# Patient Record
Sex: Female | Born: 1995 | Race: Black or African American | Hispanic: No | Marital: Single | Smoking: Former smoker
Health system: Southern US, Community
[De-identification: ages and names within clinical notes are randomized; demographics above are authoritative.]

## PROBLEM LIST (undated history)

## (undated) DIAGNOSIS — O24419 Gestational diabetes mellitus in pregnancy, unspecified control: Secondary | ICD-10-CM

## (undated) DIAGNOSIS — Z789 Other specified health status: Secondary | ICD-10-CM

## (undated) HISTORY — PX: OTHER SURGICAL HISTORY: SHX169

## (undated) HISTORY — PX: DIAGNOSTIC LAPAROSCOPY: SUR761

---

## 2014-06-22 ENCOUNTER — Encounter (HOSPITAL_COMMUNITY): Payer: Self-pay | Admitting: Pharmacist

## 2014-06-23 ENCOUNTER — Encounter (HOSPITAL_COMMUNITY): Payer: Self-pay | Admitting: *Deleted

## 2014-06-24 ENCOUNTER — Other Ambulatory Visit: Payer: Self-pay | Admitting: Obstetrics & Gynecology

## 2014-06-27 ENCOUNTER — Ambulatory Visit (HOSPITAL_COMMUNITY): Payer: 59 | Admitting: Certified Registered Nurse Anesthetist

## 2014-06-27 ENCOUNTER — Encounter (HOSPITAL_COMMUNITY)
Admission: RE | Disposition: A | Discharge status: Home / Self Care | Payer: Self-pay | Source: Ambulatory Visit | Attending: Obstetrics & Gynecology

## 2014-06-27 ENCOUNTER — Encounter (HOSPITAL_COMMUNITY): Payer: Self-pay

## 2014-06-27 ENCOUNTER — Ambulatory Visit (HOSPITAL_COMMUNITY)
Admission: RE | Admit: 2014-06-27 | Discharge: 2014-06-27 | Disposition: A | Discharge status: Home / Self Care | Payer: 59 | Source: Ambulatory Visit | Attending: Obstetrics & Gynecology | Admitting: Obstetrics & Gynecology

## 2014-06-27 ENCOUNTER — Encounter (HOSPITAL_COMMUNITY): Payer: 59 | Admitting: Certified Registered Nurse Anesthetist

## 2014-06-27 DIAGNOSIS — Z8742 Personal history of other diseases of the female genital tract: Secondary | ICD-10-CM

## 2014-06-27 DIAGNOSIS — D3911 Neoplasm of uncertain behavior of right ovary: Secondary | ICD-10-CM | POA: Insufficient documentation

## 2014-06-27 DIAGNOSIS — Z9889 Other specified postprocedural states: Secondary | ICD-10-CM

## 2014-06-27 DIAGNOSIS — N8320 Unspecified ovarian cysts: Secondary | ICD-10-CM | POA: Diagnosis present

## 2014-06-27 HISTORY — DX: Other specified health status: Z78.9

## 2014-06-27 HISTORY — PX: LAPAROSCOPY: SHX197

## 2014-06-27 LAB — CBC
HCT: 37.5 % (ref 36.0–46.0)
Hemoglobin: 12.9 g/dL (ref 12.0–15.0)
MCH: 31.4 pg (ref 26.0–34.0)
MCHC: 34.4 g/dL (ref 30.0–36.0)
MCV: 91.2 fL (ref 78.0–100.0)
PLATELETS: 238 10*3/uL (ref 150–400)
RBC: 4.11 MIL/uL (ref 3.87–5.11)
RDW: 11.9 % (ref 11.5–15.5)
WBC: 5.9 10*3/uL (ref 4.0–10.5)

## 2014-06-27 LAB — PREGNANCY, URINE: PREG TEST UR: NEGATIVE

## 2014-06-27 SURGERY — LAPAROSCOPY OPERATIVE
Anesthesia: General | Site: Abdomen

## 2014-06-27 MED ORDER — NEOSTIGMINE METHYLSULFATE 10 MG/10ML IV SOLN
INTRAVENOUS | Status: DC | PRN
  Administered 2014-06-27: 1 mg via INTRAVENOUS
  Administered 2014-06-27: 2.5 mg via INTRAVENOUS

## 2014-06-27 MED ORDER — FENTANYL CITRATE 0.05 MG/ML IJ SOLN
INTRAMUSCULAR | Status: DC | PRN
  Administered 2014-06-27 (×2): 100 ug via INTRAVENOUS
  Administered 2014-06-27: 25 ug via INTRAVENOUS
  Administered 2014-06-27: 100 ug via INTRAVENOUS
  Administered 2014-06-27: 50 ug via INTRAVENOUS

## 2014-06-27 MED ORDER — HEPARIN SODIUM (PORCINE) 5000 UNIT/ML IJ SOLN
INTRAMUSCULAR | Status: AC
  Filled 2014-06-27: qty 1

## 2014-06-27 MED ORDER — NEOSTIGMINE METHYLSULFATE 10 MG/10ML IV SOLN
INTRAVENOUS | Status: AC
  Filled 2014-06-27: qty 1

## 2014-06-27 MED ORDER — FENTANYL CITRATE 0.05 MG/ML IJ SOLN
INTRAMUSCULAR | Status: AC
  Administered 2014-06-27: 50 ug via INTRAVENOUS
  Filled 2014-06-27: qty 2

## 2014-06-27 MED ORDER — SODIUM CHLORIDE 0.9 % IJ SOLN
INTRAMUSCULAR | Status: AC
  Filled 2014-06-27: qty 10

## 2014-06-27 MED ORDER — DEXAMETHASONE SODIUM PHOSPHATE 4 MG/ML IJ SOLN
INTRAMUSCULAR | Status: AC
  Filled 2014-06-27: qty 1

## 2014-06-27 MED ORDER — LACTATED RINGERS IV SOLN
INTRAVENOUS | Status: DC
  Administered 2014-06-27: 10 mL/h via INTRAVENOUS
  Administered 2014-06-27 (×2): via INTRAVENOUS

## 2014-06-27 MED ORDER — OXYCODONE-ACETAMINOPHEN 5-325 MG PO TABS
ORAL_TABLET | ORAL | Status: AC
  Filled 2014-06-27: qty 1

## 2014-06-27 MED ORDER — SCOPOLAMINE 1 MG/3DAYS TD PT72
MEDICATED_PATCH | TRANSDERMAL | Status: AC
  Filled 2014-06-27: qty 1

## 2014-06-27 MED ORDER — BUPIVACAINE HCL 0.25 % IJ SOLN
INTRAMUSCULAR | Status: DC | PRN
  Administered 2014-06-27: 10 mL

## 2014-06-27 MED ORDER — SILVER NITRATE-POT NITRATE 75-25 % EX MISC
CUTANEOUS | Status: DC | PRN
  Administered 2014-06-27: 3

## 2014-06-27 MED ORDER — ROCURONIUM BROMIDE 100 MG/10ML IV SOLN
INTRAVENOUS | Status: DC | PRN
  Administered 2014-06-27: 40 mg via INTRAVENOUS

## 2014-06-27 MED ORDER — BUPIVACAINE HCL (PF) 0.25 % IJ SOLN
INTRAMUSCULAR | Status: AC
  Filled 2014-06-27: qty 30

## 2014-06-27 MED ORDER — ONDANSETRON HCL 4 MG/2ML IJ SOLN
INTRAMUSCULAR | Status: AC
  Filled 2014-06-27: qty 2

## 2014-06-27 MED ORDER — PROPOFOL 10 MG/ML IV BOLUS
INTRAVENOUS | Status: DC | PRN
  Administered 2014-06-27: 50 mg via INTRAVENOUS
  Administered 2014-06-27: 200 mg via INTRAVENOUS
  Administered 2014-06-27: 40 mg via INTRAVENOUS

## 2014-06-27 MED ORDER — HEPARIN SODIUM (PORCINE) 5000 UNIT/ML IJ SOLN
INTRAMUSCULAR | Status: DC | PRN
  Administered 2014-06-27: 5000 [IU]

## 2014-06-27 MED ORDER — MIDAZOLAM HCL 2 MG/2ML IJ SOLN
INTRAMUSCULAR | Status: AC
  Filled 2014-06-27: qty 2

## 2014-06-27 MED ORDER — HYDROMORPHONE HCL 1 MG/ML IJ SOLN
INTRAMUSCULAR | Status: DC | PRN
  Administered 2014-06-27: 1 mg via INTRAVENOUS

## 2014-06-27 MED ORDER — PROPOFOL 10 MG/ML IV EMUL
INTRAVENOUS | Status: AC
  Filled 2014-06-27: qty 20

## 2014-06-27 MED ORDER — SILVER NITRATE-POT NITRATE 75-25 % EX MISC
CUTANEOUS | Status: AC
  Filled 2014-06-27: qty 1

## 2014-06-27 MED ORDER — HYDROMORPHONE HCL 1 MG/ML IJ SOLN
INTRAMUSCULAR | Status: AC
  Filled 2014-06-27: qty 1

## 2014-06-27 MED ORDER — FENTANYL CITRATE 0.05 MG/ML IJ SOLN
INTRAMUSCULAR | Status: AC
  Filled 2014-06-27: qty 5

## 2014-06-27 MED ORDER — OXYCODONE-ACETAMINOPHEN 5-325 MG PO TABS
1.0000 | ORAL_TABLET | ORAL | Status: DC | PRN
  Administered 2014-06-27: 1 via ORAL

## 2014-06-27 MED ORDER — MEPERIDINE HCL 25 MG/ML IJ SOLN: 6.2500 mg | INTRAMUSCULAR | Status: DC | PRN

## 2014-06-27 MED ORDER — KETOROLAC TROMETHAMINE 30 MG/ML IJ SOLN: 15.0000 mg | Freq: Once | INTRAMUSCULAR | Status: DC | PRN

## 2014-06-27 MED ORDER — LACTATED RINGERS IR SOLN
Status: DC | PRN
  Administered 2014-06-27: 3000 mL

## 2014-06-27 MED ORDER — DEXAMETHASONE SODIUM PHOSPHATE 4 MG/ML IJ SOLN
INTRAMUSCULAR | Status: DC | PRN
  Administered 2014-06-27: 4 mg via INTRAVENOUS

## 2014-06-27 MED ORDER — KETOROLAC TROMETHAMINE 30 MG/ML IJ SOLN
INTRAMUSCULAR | Status: DC | PRN
  Administered 2014-06-27: 30 mg via INTRAVENOUS

## 2014-06-27 MED ORDER — FENTANYL CITRATE 0.05 MG/ML IJ SOLN
25.0000 ug | INTRAMUSCULAR | Status: DC | PRN
  Administered 2014-06-27 (×2): 50 ug via INTRAVENOUS

## 2014-06-27 MED ORDER — ROCURONIUM BROMIDE 100 MG/10ML IV SOLN
INTRAVENOUS | Status: AC
  Filled 2014-06-27: qty 1

## 2014-06-27 MED ORDER — MIDAZOLAM HCL 2 MG/2ML IJ SOLN
INTRAMUSCULAR | Status: DC | PRN
  Administered 2014-06-27: 2 mg via INTRAVENOUS

## 2014-06-27 MED ORDER — LIDOCAINE HCL (CARDIAC) 20 MG/ML IV SOLN
INTRAVENOUS | Status: DC | PRN
  Administered 2014-06-27: 80 mg via INTRAVENOUS

## 2014-06-27 MED ORDER — LIDOCAINE HCL (CARDIAC) 20 MG/ML IV SOLN
INTRAVENOUS | Status: AC
  Filled 2014-06-27: qty 5

## 2014-06-27 MED ORDER — GLYCOPYRROLATE 0.2 MG/ML IJ SOLN
INTRAMUSCULAR | Status: AC
  Filled 2014-06-27: qty 2

## 2014-06-27 MED ORDER — GLYCOPYRROLATE 0.2 MG/ML IJ SOLN
INTRAMUSCULAR | Status: DC | PRN
  Administered 2014-06-27: 0.4 mg via INTRAVENOUS
  Administered 2014-06-27: .2 mg via INTRAVENOUS

## 2014-06-27 MED ORDER — SCOPOLAMINE 1 MG/3DAYS TD PT72
1.0000 | MEDICATED_PATCH | TRANSDERMAL | Status: DC
  Administered 2014-06-27: 1.5 mg via TRANSDERMAL

## 2014-06-27 MED ORDER — PROMETHAZINE HCL 25 MG/ML IJ SOLN: 6.2500 mg | INTRAMUSCULAR | Status: DC | PRN

## 2014-06-27 SURGICAL SUPPLY — 39 items
BENZOIN TINCTURE PRP APPL 2/3 (GAUZE/BANDAGES/DRESSINGS) ×3 IMPLANT
BLADE SURG 11 STRL SS (BLADE) ×3 IMPLANT
CABLE HIGH FREQUENCY MONO STRZ (ELECTRODE) ×3 IMPLANT
CATH ROBINSON RED A/P 16FR (CATHETERS) ×3 IMPLANT
CLOSURE WOUND 1/4X4 (GAUZE/BANDAGES/DRESSINGS) ×1
CLOTH BEACON ORANGE TIMEOUT ST (SAFETY) ×3 IMPLANT
DERMABOND ADVANCED (GAUZE/BANDAGES/DRESSINGS) ×2
DERMABOND ADVANCED .7 DNX12 (GAUZE/BANDAGES/DRESSINGS) ×1 IMPLANT
DRSG COVADERM PLUS 2X2 (GAUZE/BANDAGES/DRESSINGS) ×6 IMPLANT
DRSG OPSITE POSTOP 3X4 (GAUZE/BANDAGES/DRESSINGS) ×3 IMPLANT
DURAPREP 26ML APPLICATOR (WOUND CARE) ×3 IMPLANT
ELECT REM PT RETURN 9FT ADLT (ELECTROSURGICAL) ×3
ELECTRODE REM PT RTRN 9FT ADLT (ELECTROSURGICAL) ×1 IMPLANT
FILTER SMOKE EVAC LAPAROSHD (FILTER) ×3 IMPLANT
GLOVE BIO SURGEON STRL SZ 6.5 (GLOVE) ×2 IMPLANT
GLOVE BIO SURGEONS STRL SZ 6.5 (GLOVE) ×1
GLOVE BIOGEL PI IND STRL 7.0 (GLOVE) ×2 IMPLANT
GLOVE BIOGEL PI INDICATOR 7.0 (GLOVE) ×4
GOWN STRL REUS W/TWL LRG LVL3 (GOWN DISPOSABLE) ×6 IMPLANT
LIGASURE 5MM LAPAROSCOPIC (INSTRUMENTS) IMPLANT
NEEDLE INSUFFLATION 120MM (ENDOMECHANICALS) IMPLANT
NS IRRIG 1000ML POUR BTL (IV SOLUTION) ×3 IMPLANT
PACK LAPAROSCOPY BASIN (CUSTOM PROCEDURE TRAY) ×3 IMPLANT
POUCH SPECIMEN RETRIEVAL 10MM (ENDOMECHANICALS) IMPLANT
PROTECTOR NERVE ULNAR (MISCELLANEOUS) ×6 IMPLANT
SET IRRIG TUBING LAPAROSCOPIC (IRRIGATION / IRRIGATOR) ×3 IMPLANT
SLEEVE XCEL OPT CAN 5 100 (ENDOMECHANICALS) ×3 IMPLANT
SOLUTION ELECTROLUBE (MISCELLANEOUS) IMPLANT
STRIP CLOSURE SKIN 1/4X4 (GAUZE/BANDAGES/DRESSINGS) ×2 IMPLANT
SUT MNCRL AB 4-0 PS2 18 (SUTURE) IMPLANT
SUT MON AB 4-0 PS1 27 (SUTURE) ×3 IMPLANT
SUT VICRYL 0 UR6 27IN ABS (SUTURE) ×3 IMPLANT
SYR 5ML LL (SYRINGE) IMPLANT
TOWEL OR 17X24 6PK STRL BLUE (TOWEL DISPOSABLE) ×6 IMPLANT
TRAY FOLEY CATH 14FR (SET/KITS/TRAYS/PACK) IMPLANT
TROCAR XCEL NON-BLD 11X100MML (ENDOMECHANICALS) ×3 IMPLANT
TROCAR XCEL NON-BLD 5MMX100MML (ENDOMECHANICALS) ×3 IMPLANT
WARMER LAPAROSCOPE (MISCELLANEOUS) ×3 IMPLANT
WATER STERILE IRR 1000ML POUR (IV SOLUTION) ×3 IMPLANT

## 2014-06-27 NOTE — H&P (Signed)
Autumn Lawrence is an 18 y.o. female  P0 presents for  Diagnostic laparoscopic possible right ovarian cystectomy.   She is presently sexually active in a monogamous relationship not using contraception. She does not desire pregnancy and would like to start contraception. She is s/p Laparoscopic LSO in 2009 for what sounds like a torsed Dermoid. She says the ovarian cyst had "hair growing" and her ovary torsed and was not viable. She was also told she had right ovarian cyst at the time of the procedure. The patient now c/o lower abdominal pain, for about 3 weeks now. The pain comes and goes. The pain is in the Right LQ, started a month ago which comes and goes once every 4 days. The pain is intermittent, comes and goes. She was in pain this am and now the pain is gone. The pain is characterized as both cramping and sharp. The pain has moved to mid pelvis, but is normally located focally on the Right LQ. She has no associated symptoms (like nausea) and is improved with laying down. Menses q 28 days last 4 days normal flow no pain no dyspareunia    Pertinent Gynecological History: Menses: regular every 28 days without intermenstrual spotting Bleeding: normal Contraception: none DES exposure: denies Blood transfusions: none Sexually transmitted diseases: past history: treated Chlamydia Previous GYN Procedures: laparoscopic LSO  Last mammogram: n/a  Last pap:  N/a OB History: G0, P0   Menstrual History: Menarche age: 40  Patient's last menstrual period was 06/07/2014.    Past Medical History  Diagnosis Date  . Medical history non-contributory     Past Surgical History  Procedure Laterality Date  . Diagnostic laparoscopy    . Ovary removed Left     No family history on file.  Social History:  reports that she has quit smoking. She does not have any smokeless tobacco history on file. She reports that she uses illicit drugs (Marijuana) about 3 times per week. She reports that she does not  drink alcohol.  Allergies: No Known Allergies  No prescriptions prior to admission    ROS  Blood pressure 120/75, pulse 75, temperature 98.4 F (36.9 C), temperature source Oral, resp. rate 18, height 5\' 1"  (1.549 m), weight 80.74 kg (178 lb), last menstrual period 06/07/2014, SpO2 100.00%. Physical Exam Chaperone: Chaperone: present.  Constitutional: General Appearance: healthy-appearing, well-nourished, and well-developed.  Psychiatric: Orientation: to time, place, and person. Mood and Affect: normal mood and affect and active and alert.  Skin: Appearance: no rashes or lesions.  Lungs: Respiratory Effort: no intercostal retractions or accessory muscle usage. Auscultation: no wheezing, rales/crackles, or rhonchi and clear to auscultation.  Cardiovascular: Auscultation: RRR and no murmur. Peripheral Vascular: no varicosities, LLE edema, RLE edema, calf tenderness, or palpable cords and pedal pulses intact.  Abdomen: Auscultation/Inspection/Palpation: no hepatomegaly, splenomegaly, masses, or CVA tenderness and soft, non-distended, normal bowel sounds, and tenderness; Right lower quadrant. Hernia: none palpated.  Breast: Inspection/Palpation: no tenderness, skin changes, abnormal secretions, or distinct masses and nipple appearance normal.  Female Genitalia: Vulva: no masses, atrophy, or lesions. Vagina: no tenderness, erythema, cystocele, rectocele, abnormal vaginal discharge, or vesicle(s) or ulcers. Cervix: no discharge or cervical motion tenderness and grossly normal. Uterus: normal size and shape and midline, mobile, non-tender, and no uterine prolapse. Bladder/Urethra: no urethral discharge or mass and normal meatus, bladder non distended, and Urethra well supported. Adnexa/Parametria: no parametrial tenderness or mass and adnexal tenderness and ovarian mass; palpable right ovarian cyst, tender.  Lymph Nodes: Palpation: non tender  submandibular nodes, axillary nodes, or inguinal nodes.   Rectal Exam: Rectum: normal perianal skin and sphincter tone and no hemorrhoids or masses.   Results for orders placed during the hospital encounter of 06/27/14 (from the past 24 hour(s))  PREGNANCY, URINE     Status: None   Collection Time    06/27/14  1:40 PM      Result Value Ref Range   Preg Test, Ur NEGATIVE  NEGATIVE  CBC     Status: None   Collection Time    06/27/14  1:50 PM      Result Value Ref Range   WBC 5.9  4.0 - 10.5 K/uL   RBC 4.11  3.87 - 5.11 MIL/uL   Hemoglobin 12.9  12.0 - 15.0 g/dL   HCT 37.5  36.0 - 46.0 %   MCV 91.2  78.0 - 100.0 fL   MCH 31.4  26.0 - 34.0 pg   MCHC 34.4  30.0 - 36.0 g/dL   RDW 11.9  11.5 - 15.5 %   Platelets 238  150 - 400 K/uL    No results found.  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  We discussed that the [place] does not show the characteristic findings of a Dermoid, but it is septated and large.  With her h/o intermittent pain we discussed risk of torsion of this cyst and loss of her right ovary.  Patient t for diagnostic laparoscopy / operative laparoscopy right ovarian cystectomy   Ja Ohman, [place] 06/27/2014, 3:50 PM

## 2014-06-27 NOTE — Anesthesia Preprocedure Evaluation (Signed)
Anesthesia Evaluation  Patient identified by MRN, date of birth, ID band Patient awake    Reviewed: Allergy & Precautions, H&P , NPO status , Patient's Chart, lab work & pertinent test results, reviewed documented beta blocker date and time   History of Anesthesia Complications Negative for: history of anesthetic complications  Airway Mallampati: III TM Distance: >3 FB Neck ROM: full    Dental  (+) Teeth Intact   Pulmonary neg pulmonary ROS,  breath sounds clear to auscultation  Pulmonary exam normal       Cardiovascular Exercise Tolerance: Good negative cardio ROS  Rhythm:regular Rate:Normal     Neuro/Psych negative neurological ROS  negative psych ROS   GI/Hepatic negative GI ROS, (+)     substance abuse (2-3 x/week)  marijuana use,   Endo/Other  BMI 33.7  Renal/GU negative Renal ROS  Female GU complaint     Musculoskeletal   Abdominal   Peds  Hematology negative hematology ROS (+)   Anesthesia Other Findings   Reproductive/Obstetrics negative OB ROS                           Anesthesia Physical Anesthesia Plan  ASA: II  Anesthesia Plan: General ETT   Post-op Pain Management:    Induction:   Airway Management Planned:   Additional Equipment:   Intra-op Plan:   Post-operative Plan:   Informed Consent: I have reviewed the patients History and Physical, chart, labs and discussed the procedure including the risks, benefits and alternatives for the proposed anesthesia with the patient or authorized representative who has indicated his/her understanding and acceptance.   Dental Advisory Given  Plan Discussed with: CRNA and Surgeon  Anesthesia Plan Comments:         Anesthesia Quick Evaluation

## 2014-06-27 NOTE — Anesthesia Postprocedure Evaluation (Signed)
  Anesthesia Post-op Note  Patient: Autumn Lawrence  Procedure(s) Performed: Procedure(s): Operative Laparoscopy with Right Ovarian Cystectomy (N/A)  Patient Location: PACU  Anesthesia Type:General  Level of Consciousness: awake, alert  and oriented  Airway and Oxygen Therapy: Patient Spontanous Breathing  Post-op Pain: mild  Post-op Assessment: Post-op Vital signs reviewed, Patient's Cardiovascular Status Stable, Respiratory Function Stable, Patent Airway, No signs of Nausea or vomiting and Pain level controlled  Post-op Vital Signs: Reviewed and stable  Last Vitals:  Filed Vitals:   06/27/14 2000  BP: 126/70  Pulse: 88  Temp:   Resp: 19    Complications: No apparent anesthesia complications

## 2014-06-27 NOTE — Transfer of Care (Signed)
Immediate Anesthesia Transfer of Care Note  Patient: [facility]  Procedure(s) Performed: Procedure(s): Operative Laparoscopy with Right Ovarian Cystectomy (N/A)  Patient Location: PACU  Anesthesia Type:General  Level of Consciousness: awake, alert  and oriented  Airway & Oxygen Therapy: Patient Spontanous Breathing and Patient connected to nasal cannula oxygen  Post-op Assessment: Report given to PACU RN and Post -op Vital signs reviewed and stable  Post vital signs: Reviewed and stable  Complications: No apparent anesthesia complications

## 2014-06-27 NOTE — Discharge Instructions (Signed)
DISCHARGE INSTRUCTIONS: Laparoscopy   The following instructions have been prepared to help you care for yourself upon your return home today.  MAY HAVE IBUPROFEN (MOTRIN, ADVIL) OR ALEVE AFTER MIDNIGHT FOR CRAMPS!!  Wound care:  Do not get the incision wet for the first 24 hours. The incision should be kept clean and dry.  The Band-Aids or dressings may be removed the day after surgery.  Should the incision become sore, red, and swollen after the first week, check with your doctor.  Personal hygiene:  Shower the day after your procedure.  Activity and limitations:  Do NOT drive or operate any equipment today.  Do NOT lift anything more than 15 pounds for 2-3 weeks after surgery.  Do NOT rest in bed all day.  Walking is encouraged. Walk each day, starting slowly with 5-minute walks 3 or 4 times a day. Slowly increase the length of your walks.  Walk up and down stairs slowly.  Do NOT do strenuous activities, such as golfing, playing tennis, bowling, running, biking, weight lifting, gardening, mowing, or vacuuming for 2-4 weeks. Ask your doctor when it is okay to start.  Diet: Eat a light meal as desired this evening. You may resume your usual diet tomorrow.  Return to work: This is dependent on the type of work you do. For the most part you can return to a desk job within a week of surgery. If you are more active at work, please discuss this with your doctor.  What to expect after your surgery: You may have a slight burning sensation when you urinate on the first day. You may have a very small amount of blood in the urine. Expect to have a small amount of vaginal discharge/light bleeding for 1-2 weeks. It is not unusual to have abdominal soreness and bruising for up to 2 weeks. You may be tired and need more rest for about 1 week. You may experience shoulder pain for 24-72 hours. Lying flat in bed may relieve it.  Call your doctor for any of the following:  Develop a fever of  100.4 or greater  Inability to urinate 6 hours after discharge from hospital  Severe pain not relieved by pain medications  Persistent of heavy bleeding at incision site  Redness or swelling around incision site after a week  Increasing nausea or vomiting  Patient Signature________________________________________ Nurse Signature_________________________________________

## 2014-06-28 ENCOUNTER — Encounter (HOSPITAL_COMMUNITY): Payer: Self-pay | Admitting: Obstetrics & Gynecology

## 2014-06-28 MED FILL — Heparin Sodium (Porcine) Inj 5000 Unit/ML: INTRAMUSCULAR | Qty: 1 | Status: AC

## 2014-06-28 NOTE — Op Note (Signed)
Preop Diagnosis: Right Ovarian Cyst   Postop Diagnosis: Right Ovarian Cyst   Procedure: OVARIAN CYSTECTOMY   Anesthesia: General   Attending / Surgeon : Sanjuana Kava MD  Assistant: Alden Hipp MD  Findings: Normal appearing uterus, no left fallopian tube or ovary. Large 10cm right ovarian cyst, appears complex in nature extending to  Posterior cul de sac.  Healthy right ovarian tissue preserved. Normal appearing right fallopian tube.  The cyst with notable hair, sebum  Consistent with a Dermoid  Pathology: Source of Specimen:  right ovarian cyst  Fluids: 1300 mL  UOP: 50CC OF clear urine  EBL: less than 50 mL  Complications: none  Procedure: The patient was taken to the operating room after the risks, benefits, alternatives, complications, treatment options, and expected outcomes were discussed with the patient. The patient verbalized understanding, the patient concurred with the proposed plan and consent signed and witnessed. The patient was taken to the Operating Room, identified as Autumn Lawrence and the procedure verified as laparoscopic ovarian cystectomy. A Time Out was held and the above information confirmed.  The patient was taken to the operating room after appropriate identification and placed on the operating table. After the attainment of adequate general anesthesia she was placed in the modified lithotomy position using Allen stirrups. Both upper extremities were padded and placed by her side. An examination under anesthesia was performed.  The abdomen was prepped with DuraPrep. The perineum and vagina were prepped with multiple layers of Betadine.  The bladder was catheterized. The abdomen and perineum were draped as a sterile field. .  An acorn tenaculum was placed into the endometrial canal and fixed to the anterior lip of the cervix.  The surgeon re- gowned and gloved.    A subumbilical incision was made and the 51mm Excel Visiport attached to the 10 mm 0-degree  laparoscope was used to attempt direct entry into the peritoneal cavity while tenting up the abdomen. This was not successful, so open laparoscopy was performed but identifying the fascia, tenting it up with Kocher clamps and entering it sharply using Tursi scissors. The ends of the fascia were tagged with 0-vicryl on a UR-6 needle. The peritoneum was tented up between two hemostats and entered sharply using metzenbaum scissors. The trocar was placed into the abdomen with laparoscope attached and entry into the peritoneal cavity was confirm by video visualization. Approximately 3 L of CO2 gas was placed in the abdomen and the patient was placed in steep Trendelenburg. A small incision was made along the right lower abdomen and a 5 mm trocar was inserted into the abdominal cavity under direct visualization. An identical procedure was carried out on the opposite side. A complete pelvic survey was performed with findings noted above. Pictures were taken of the patient's pelvic structures. There was no noted injury with placement of any trochars.   The large right ovarian cyst was incised on the antimesenteric side and a combination of blunt and sharp dissection using the monopolar cautery  to dissect the overlying ovarian cortex off of the underlying cyst. The cyst was removed from the cyst wall and then drained with a 20 cc endoscopic needle / syringe. There was egress of clear fluid.  The dissection was continued meticulously until the entire cyst could be dissected off of the ovarian cortex and placed in the pelvis for subsequent retrieval. The endocatch bag was placed through the 103mm trocar and the cyst was brought out through the umbilicus. There was noted hair and sebum  consistent with a Dermoid. The trochar was replaced and the ovarian bed was examined and noted to be oozing. Bipolar cautery was used assure hemostasis in the ovarian cortex. Copious irrigation was carried out and all operative areas noted to be  hemostatic.    All trochars were then removed from the peritoneal cavity under direct visualization as the CO2 was allowed to escape. The supra-umbilical incision was closed with the fascial sutures that were previously tagged and the skin closed in subcuticular fashion with of 3-0 Monocryl then covered with Dermabond. The smaller incisions closed with Dermabond.  The uterine manipulator was removed. The patient was awakened from general anesthesia and taken to the recovery room in satisfactory condition having tolerated the procedure well with sponge and instrument counts correct. It was anticipated that she would be discharged home later that afternoon.  Autumn Lawrence Autumn Lawrence

## 2015-09-04 LAB — OB RESULTS CONSOLE GC/CHLAMYDIA
Chlamydia: NEGATIVE
Gonorrhea: NEGATIVE

## 2015-09-04 LAB — OB RESULTS CONSOLE RPR: RPR: NONREACTIVE

## 2015-09-04 LAB — OB RESULTS CONSOLE VARICELLA ZOSTER ANTIBODY, IGG: VARICELLA IGG: IMMUNE

## 2015-09-04 LAB — OB RESULTS CONSOLE ANTIBODY SCREEN: Antibody Screen: NEGATIVE

## 2015-09-04 LAB — OB RESULTS CONSOLE ABO/RH: RH Type: POSITIVE

## 2015-09-04 LAB — OB RESULTS CONSOLE RUBELLA ANTIBODY, IGM: RUBELLA: IMMUNE

## 2015-09-04 LAB — OB RESULTS CONSOLE PLATELET COUNT: PLATELETS: 217 10*3/uL

## 2015-09-04 LAB — OB RESULTS CONSOLE HIV ANTIBODY (ROUTINE TESTING): HIV: NONREACTIVE

## 2015-09-04 LAB — OB RESULTS CONSOLE HEPATITIS B SURFACE ANTIGEN: Hepatitis B Surface Ag: NEGATIVE

## 2015-09-04 LAB — OB RESULTS CONSOLE HGB/HCT, BLOOD
HCT: 36 %
Hemoglobin: 12.2 g/dL

## 2015-09-07 ENCOUNTER — Ambulatory Visit (HOSPITAL_COMMUNITY): Payer: Self-pay | Attending: Nurse Practitioner

## 2015-09-22 ENCOUNTER — Other Ambulatory Visit (HOSPITAL_COMMUNITY): Payer: Self-pay | Admitting: Nurse Practitioner

## 2015-09-22 DIAGNOSIS — O24419 Gestational diabetes mellitus in pregnancy, unspecified control: Secondary | ICD-10-CM

## 2015-09-24 NOTE — L&D Delivery Note (Signed)
Patient is 20 y.o. G1P0000 [redacted]w[redacted]d admitted IOL for A2/BGDM  Delivery Note At 10:34 PM a viable female was delivered via Vaginal, Spontaneous Delivery (Presentation: Right Occiput Anterior).  APGAR: 9, 9; weight pending.   Placenta status: Intact, Spontaneous.  Cord: 3 vessels with the following complications: None.  Cord pH: n/a  Anesthesia: Epidural  Episiotomy: None Lacerations: None Suture Repair: n/a Est. Blood Loss (mL): 100  Mom to postpartum.  Baby to Couplet care / Skin to Skin.  [name] 02/24/2016, 10:52 PM  OB fellow attestation: Patient is a G1P0000 at [redacted]w[redacted]d who was admitted for IOL for GDM. She progressed with augmentation via cytotec, foley balloon, and pitocin.  I was gloved and present for delivery in its entirety.  Second stage of labor progressed.   Complications: none  Lacerations: None  EBL: 100  [name], MD 10:56 PM

## 2015-10-02 ENCOUNTER — Encounter: Payer: Medicaid Other | Attending: Family Medicine | Admitting: *Deleted

## 2015-10-02 ENCOUNTER — Encounter: Payer: Self-pay | Admitting: Obstetrics and Gynecology

## 2015-10-02 ENCOUNTER — Ambulatory Visit (INDEPENDENT_AMBULATORY_CARE_PROVIDER_SITE_OTHER): Payer: Self-pay | Admitting: Obstetrics and Gynecology

## 2015-10-02 VITALS — BP 130/69 | HR 80 | Temp 98.2°F | Wt 177.0 lb

## 2015-10-02 DIAGNOSIS — O24419 Gestational diabetes mellitus in pregnancy, unspecified control: Secondary | ICD-10-CM | POA: Diagnosis not present

## 2015-10-02 DIAGNOSIS — O0990 Supervision of high risk pregnancy, unspecified, unspecified trimester: Secondary | ICD-10-CM

## 2015-10-02 DIAGNOSIS — O24319 Unspecified pre-existing diabetes mellitus in pregnancy, unspecified trimester: Secondary | ICD-10-CM | POA: Insufficient documentation

## 2015-10-02 DIAGNOSIS — Z713 Dietary counseling and surveillance: Secondary | ICD-10-CM | POA: Insufficient documentation

## 2015-10-02 LAB — COMPREHENSIVE METABOLIC PANEL
ALBUMIN: 3.8 g/dL (ref 3.6–5.1)
ALT: 15 U/L (ref 6–29)
AST: 17 U/L (ref 10–30)
Alkaline Phosphatase: 69 U/L (ref 33–115)
BUN: 5 mg/dL — ABNORMAL LOW (ref 7–25)
CALCIUM: 8.9 mg/dL (ref 8.6–10.2)
CO2: 22 mmol/L (ref 20–31)
Chloride: 101 mmol/L (ref 98–110)
Creat: 0.52 mg/dL (ref 0.50–1.10)
GLUCOSE: 76 mg/dL (ref 65–99)
POTASSIUM: 4.1 mmol/L (ref 3.5–5.3)
Sodium: 134 mmol/L — ABNORMAL LOW (ref 135–146)
Total Bilirubin: 0.3 mg/dL (ref 0.2–1.2)
Total Protein: 6.7 g/dL (ref 6.1–8.1)

## 2015-10-02 LAB — POCT URINALYSIS DIP (DEVICE)
Bilirubin Urine: NEGATIVE
Glucose, UA: NEGATIVE mg/dL
Hgb urine dipstick: NEGATIVE
KETONES UR: NEGATIVE mg/dL
Leukocytes, UA: NEGATIVE
Nitrite: NEGATIVE
PH: 7 (ref 5.0–8.0)
PROTEIN: NEGATIVE mg/dL
Specific Gravity, Urine: 1.025 (ref 1.005–1.030)
Urobilinogen, UA: 0.2 mg/dL (ref 0.0–1.0)

## 2015-10-02 LAB — HEMOGLOBIN A1C
Hgb A1c MFr Bld: 5.6 % (ref <5.7)
Mean Plasma Glucose: 114 mg/dL (ref <117)

## 2015-10-02 LAB — TSH: TSH: 0.764 u[IU]/mL (ref 0.350–4.500)

## 2015-10-02 MED ORDER — ASPIRIN EC 81 MG PO TBEC: 81.0000 mg | DELAYED_RELEASE_TABLET | Freq: Every day | ORAL | Status: DC

## 2015-10-02 NOTE — Progress Notes (Signed)
Nutrition note: 1st visit consult & GDM diet education Pt has h/o obesity & was recently diagnosed with GDM. Pt has lost 2# @ [redacted]w[redacted]d but pt reports she weighed even less at the beginning of her her pregnancy due to N&V but her weight has started to increase since her N&V have stopped. Pt reports eating 3 meals & 4-5 snacks/d. Intake appears high in sugar-sweetened beverages daily. Pt reports no N&V or heartburn. Pt reports no walking or physical activity. Pt received verbal & written education on GDM diet. Discussed benefits & importance of BF. Encouraged pt to decrease with goal of eliminating sugar-sweetened beverages. Encouraged ~30 mins of walking/d. Discussed wt gain goals of 11-20# or 0.5#/wk. Pt agrees to follow GDM diet with 3 meals & 1-3 snacks/d with appropriate CHO/ protein combination. Pt does not have [place] but plans to apply. Pt is unsure about BF. F/u in 4-6 wks Vladimir Faster, MS, RD, LDN, [facility]

## 2015-10-02 NOTE — Progress Notes (Signed)
  Patient was seen on 10/02/15 for Gestational Diabetes self-management . The following learning objectives were met by the patient :   States the definition of Gestational Diabetes  States when to check blood glucose levels  Demonstrates proper blood glucose monitoring techniques  States the effect of stress and exercise on blood glucose levels  States the importance of limiting caffeine and abstaining from alcohol and smoking  Plan:  Consider  increasing your activity level by walking daily as tolerated Begin checking BG before breakfast and 2 hours after first bit of breakfast, lunch and dinner after  as directed by MD  Take medication  as directed by MD  Blood glucose monitor given: [employer] Lot # [id number] Exp: 2017/11/16 Blood glucose reading: FBS 60m/dl  Patient instructed to monitor glucose levels: FBS: 60 - <90 2 hour: <120  Patient received the following handouts:  Nutrition Diabetes and Pregnancy  Carbohydrate Counting List  Meal Planning worksheet  Patient will be seen for follow-up as needed.

## 2015-10-02 NOTE — Progress Notes (Signed)
Needs to see diabetes ed

## 2015-10-02 NOTE — Progress Notes (Signed)
Subjective:  Autumn Lawrence is a 20 y.o. G1P0000 at [redacted]w[redacted]d being seen today for initial prenatal care.  She is currently monitored for the following issues for this high-risk pregnancy and has Supervision of high-risk pregnancy and Gestational diabetes on her problem list.   Says 1-hour at @ 14 weeks was 190 or higher. No records yet obtained.  Patient reports no complaints.  Contractions: Not present. Vag. Bleeding: None.  Movement: Present. Denies leaking of fluid.   The following portions of the patient's history were reviewed and updated as appropriate: allergies, current medications, past family history, past medical history, past social history, past surgical history and problem list. Problem list updated.  Objective:   Filed Vitals:   10/02/15 0920  BP: 130/69  Pulse: 80  Temp: 98.2 F (36.8 C)  Weight: 177 lb (80.287 kg)    Fetal Status: Fetal Heart Rate (bpm): 151   Movement: Present     General:  Alert, oriented and cooperative. Patient is in no acute distress.  Skin: Skin is warm and dry. No rash noted.   Cardiovascular: Normal heart rate noted  Respiratory: Normal respiratory effort, no problems with respiration noted  Abdomen: Soft, gravid, appropriate for gestational age. Pain/Pressure: Present     Pelvic: Vag. Bleeding: None     Cervical exam deferred        Extremities: Normal range of motion.  Edema: None  Mental Status: Normal mood and affect. Normal behavior. Normal judgment and thought content.   Urinalysis:      Assessment and Plan:  Pregnancy: G1P0000 at [redacted]w[redacted]d  # GDM - per patient at 14 weeks 1-hour was greater than 190. No need for 3-hour; this could represent bdm - checking BDM labs today, quad screen, fetal echo, ophtho referral - diabetes educator and nutrition today, f/u 1 wk diabetes educator, 2 weeks established prenatal - [facility] prenatal records pending - start aspirin    Preterm labor symptoms and general obstetric precautions including but not  limited to vaginal bleeding, contractions, leaking of fluid and fetal movement were reviewed in detail with the patient. Please refer to After Visit Summary for other counseling recommendations.   Gwynne Edinger, MD

## 2015-10-04 LAB — AFP, QUAD SCREEN
AFP: 54.7 ng/mL
Curr Gest Age: 18.4 wks.days
HCG TOTAL: 16.89 [IU]/mL
INH: 125.1 pg/mL
INTERPRETATION-AFP: NEGATIVE
MOM FOR AFP: 1.13
MoM for INH: 0.79
MoM for hCG: 0.7
Open Spina bifida: NEGATIVE
Osb Risk: 1:17100 {titer}
Tri 18 Scr Risk Est: NEGATIVE
uE3 Mom: 0.81
uE3 Value: 1.13 ng/mL

## 2015-10-06 LAB — CREATININE CLEARANCE, URINE, 24 HOUR
CREAT CLEAR: 232 mL/min — AB (ref 75–115)
CREATININE 24H UR: 1.64 g/(24.h) (ref 0.63–2.50)
CREATININE, URINE: 168 mg/dL (ref 20–320)
Creatinine: 0.49 mg/dL — ABNORMAL LOW (ref 0.50–1.10)

## 2015-10-06 LAB — PROTEIN, URINE, 24 HOUR
Protein, 24H Urine: 88 mg/24 h (ref <150)
Protein, Urine: 9 mg/dL (ref 5–24)

## 2015-10-09 ENCOUNTER — Ambulatory Visit: Payer: Self-pay | Admitting: *Deleted

## 2015-10-09 ENCOUNTER — Encounter: Payer: Medicaid Other | Admitting: *Deleted

## 2015-10-09 DIAGNOSIS — O24419 Gestational diabetes mellitus in pregnancy, unspecified control: Secondary | ICD-10-CM

## 2015-10-09 NOTE — Progress Notes (Signed)
Patient present with boy friend for review of glucose readings FBS range 68-96, 2hpp range 76-137. She isnot having HS snack. Which I have encourage in an effort to regulate minimal high FBS.

## 2015-10-11 ENCOUNTER — Ambulatory Visit (HOSPITAL_COMMUNITY): Payer: Self-pay

## 2015-10-16 ENCOUNTER — Encounter: Payer: Self-pay | Admitting: Family Medicine

## 2015-10-16 ENCOUNTER — Ambulatory Visit (INDEPENDENT_AMBULATORY_CARE_PROVIDER_SITE_OTHER): Payer: Self-pay | Admitting: Family Medicine

## 2015-10-16 ENCOUNTER — Encounter: Payer: Self-pay | Admitting: *Deleted

## 2015-10-16 VITALS — BP 124/70 | HR 78 | Wt 180.7 lb

## 2015-10-16 DIAGNOSIS — O99212 Obesity complicating pregnancy, second trimester: Secondary | ICD-10-CM

## 2015-10-16 DIAGNOSIS — O0992 Supervision of high risk pregnancy, unspecified, second trimester: Secondary | ICD-10-CM

## 2015-10-16 DIAGNOSIS — O0990 Supervision of high risk pregnancy, unspecified, unspecified trimester: Secondary | ICD-10-CM

## 2015-10-16 DIAGNOSIS — E669 Obesity, unspecified: Secondary | ICD-10-CM | POA: Insufficient documentation

## 2015-10-16 DIAGNOSIS — O24419 Gestational diabetes mellitus in pregnancy, unspecified control: Secondary | ICD-10-CM

## 2015-10-16 MED ORDER — ASPIRIN EC 81 MG PO TBEC: 81.0000 mg | DELAYED_RELEASE_TABLET | Freq: Every day | ORAL | Status: DC

## 2015-10-16 NOTE — Patient Instructions (Signed)

## 2015-10-16 NOTE — Progress Notes (Signed)
Subjective:  Autumn Lawrence is a 20 y.o. G1P0000 at [redacted]w[redacted]d being seen today for ongoing prenatal care.  She is currently monitored for the following issues for this high-risk pregnancy and has Supervision of high-risk pregnancy and A2/BDM on her problem list.  Patient reports no complaints.  Contractions: Not present. Vag. Bleeding: None.  Movement: Present. Denies leaking of fluid.   Fasting68, 75, 82, 74, 80,  65, 86 2hr PP 97, 82, 76, 81, 112, 83, 118, 78, 109, 98, 78, 116, 103, 86, 101, 100, 92, 115, 96  The following portions of the patient's history were reviewed and updated as appropriate: allergies, current medications, past family history, past medical history, past social history, past surgical history and problem list. Problem list updated.  Objective:   Filed Vitals:   10/16/15 0954  BP: 124/70  Pulse: 78  Weight: 180 lb 11.2 oz (81.965 kg)    Fetal Status: Fetal Heart Rate (bpm): 155   Movement: Present     General:  Alert, oriented and cooperative. Patient is in no acute distress.  Skin: Skin is warm and dry. No rash noted.   Cardiovascular: Normal heart rate noted  Respiratory: Normal respiratory effort, no problems with respiration noted  Abdomen: Soft, gravid, appropriate for gestational age. Pain/Pressure: Absent     Pelvic: Vag. Bleeding: None Vag D/C Character: White   Cervical exam deferred        Extremities: Normal range of motion.  Edema: None  Mental Status: Normal mood and affect. Normal behavior. Normal judgment and thought content.   Urinalysis:      Assessment and Plan:  Pregnancy: G1P0000 at [redacted]w[redacted]d  1. Supervision of high-risk pregnancy, second trimester - updated box - [place] scheduled for 1/26  2. Gestational diabetes mellitus in second trimester, unspecified diabetic control - well controlled. No reason for medication at this point. Currently A1GDM though she was diagnosed early at 14wks given her HA1C of 5.6% she is likely NOT a diabetic outside of  pregnancy though she is definitely pre-diabetes - Continue diet control  Preterm labor symptoms and general obstetric precautions including but not limited to vaginal bleeding, contractions, leaking of fluid and fetal movement were reviewed in detail with the patient. Please refer to After Visit Summary for other counseling recommendations.   Return in about 2 weeks (around 10/30/2015) for Routine prenatal care, [facility] for Dm education. Future Appointments Date Time Provider [place]  10/19/2015 9:00 AM [facility] [place] 1 WH-US 203    Soham Hollett [place], [place]

## 2015-10-18 ENCOUNTER — Other Ambulatory Visit (HOSPITAL_COMMUNITY): Payer: Self-pay | Admitting: Nurse Practitioner

## 2015-10-18 DIAGNOSIS — Z3689 Encounter for other specified antenatal screening: Secondary | ICD-10-CM

## 2015-10-18 DIAGNOSIS — Z3A20 20 weeks gestation of pregnancy: Secondary | ICD-10-CM

## 2015-10-18 DIAGNOSIS — O99212 Obesity complicating pregnancy, second trimester: Secondary | ICD-10-CM

## 2015-10-18 DIAGNOSIS — O24419 Gestational diabetes mellitus in pregnancy, unspecified control: Secondary | ICD-10-CM

## 2015-10-19 ENCOUNTER — Ambulatory Visit (HOSPITAL_COMMUNITY)
Admission: RE | Admit: 2015-10-19 | Discharge: 2015-10-19 | Disposition: A | Discharge status: Home / Self Care | Payer: Medicaid Other | Source: Ambulatory Visit | Attending: Nurse Practitioner | Admitting: Nurse Practitioner

## 2015-10-19 ENCOUNTER — Encounter (HOSPITAL_COMMUNITY): Payer: Self-pay

## 2015-10-19 DIAGNOSIS — Z3A21 21 weeks gestation of pregnancy: Secondary | ICD-10-CM | POA: Diagnosis not present

## 2015-10-19 DIAGNOSIS — O99212 Obesity complicating pregnancy, second trimester: Secondary | ICD-10-CM

## 2015-10-19 DIAGNOSIS — Z3689 Encounter for other specified antenatal screening: Secondary | ICD-10-CM

## 2015-10-19 DIAGNOSIS — Z3A20 20 weeks gestation of pregnancy: Secondary | ICD-10-CM

## 2015-10-19 DIAGNOSIS — Z36 Encounter for antenatal screening of mother: Secondary | ICD-10-CM | POA: Insufficient documentation

## 2015-10-19 DIAGNOSIS — O24419 Gestational diabetes mellitus in pregnancy, unspecified control: Secondary | ICD-10-CM | POA: Insufficient documentation

## 2015-11-02 ENCOUNTER — Ambulatory Visit (HOSPITAL_COMMUNITY): Payer: Self-pay

## 2015-11-02 ENCOUNTER — Ambulatory Visit (HOSPITAL_COMMUNITY): Admission: RE | Admit: 2015-11-02 | Payer: Self-pay | Source: Ambulatory Visit

## 2015-11-13 ENCOUNTER — Encounter: Payer: Self-pay | Admitting: Obstetrics and Gynecology

## 2015-11-13 ENCOUNTER — Ambulatory Visit (INDEPENDENT_AMBULATORY_CARE_PROVIDER_SITE_OTHER): Payer: Self-pay | Admitting: Obstetrics and Gynecology

## 2015-11-13 VITALS — BP 119/91 | HR 77 | Temp 98.4°F | Wt 183.7 lb

## 2015-11-13 DIAGNOSIS — O0992 Supervision of high risk pregnancy, unspecified, second trimester: Secondary | ICD-10-CM

## 2015-11-13 DIAGNOSIS — E669 Obesity, unspecified: Secondary | ICD-10-CM

## 2015-11-13 DIAGNOSIS — O24419 Gestational diabetes mellitus in pregnancy, unspecified control: Secondary | ICD-10-CM

## 2015-11-13 DIAGNOSIS — O99212 Obesity complicating pregnancy, second trimester: Secondary | ICD-10-CM

## 2015-11-13 NOTE — Progress Notes (Signed)
Subjective:  Autumn Lawrence is a 20 y.o. G1P0000 at [redacted]w[redacted]d being seen today for ongoing prenatal care.  She is currently monitored for the following issues for this high-risk pregnancy and has Supervision of high-risk pregnancy; A1GDM; and Obesity (BMI 30-39.9) on her problem list.  Patient reports no complaints.  Contractions: Not present. Vag. Bleeding: None.  Movement: Present. Denies leaking of fluid.   The following portions of the patient's history were reviewed and updated as appropriate: allergies, current medications, past family history, past medical history, past social history, past surgical history and problem list. Problem list updated.  Objective:   Filed Vitals:   11/13/15 1158  BP: 119/91  Pulse: 77  Temp: 98.4 F (36.9 C)  Weight: 183 lb 11.2 oz (83.326 kg)    Fetal Status: Fetal Heart Rate (bpm): 156   Movement: Present     General:  Alert, oriented and cooperative. Patient is in no acute distress.  Skin: Skin is warm and dry. No rash noted.   Cardiovascular: Normal heart rate noted  Respiratory: Normal respiratory effort, no problems with respiration noted  Abdomen: Soft, gravid, appropriate for gestational age. Pain/Pressure: Present     Pelvic: Vag. Bleeding: None Vag D/C Character: White   Cervical exam deferred        Extremities: Normal range of motion.  Edema: None  Mental Status: Normal mood and affect. Normal behavior. Normal judgment and thought content.   Urinalysis:      Assessment and Plan:  Pregnancy: G1P0000 at [redacted]w[redacted]d  1. Supervision of high-risk pregnancy, second trimester Patient is doing well without complaints  2. Obesity (BMI 30-39.9)   3. Gestational diabetes mellitus in second trimester, unspecified diabetic control Patient did not bring CBG log or meter. She reports highest fasting 121 (which occurred on 2 occasions) and highest pp 105.  Advised patient to bring CBG log at next visit Advised to consume a protein rich snack at  bedtime Continue diet control for now  Preterm labor symptoms and general obstetric precautions including but not limited to vaginal bleeding, contractions, leaking of fluid and fetal movement were reviewed in detail with the patient. Please refer to After Visit Summary for other counseling recommendations.  Return in about 2 weeks (around 11/27/2015).   Mora Bellman, MD

## 2015-11-13 NOTE — Progress Notes (Signed)
Pt reports sharp pain RUQ

## 2015-11-16 ENCOUNTER — Encounter (HOSPITAL_COMMUNITY): Payer: Self-pay

## 2015-11-16 ENCOUNTER — Ambulatory Visit (HOSPITAL_COMMUNITY)
Admission: RE | Admit: 2015-11-16 | Discharge: 2015-11-16 | Disposition: A | Discharge status: Home / Self Care | Payer: Medicaid Other | Source: Ambulatory Visit | Attending: Nurse Practitioner | Admitting: Nurse Practitioner

## 2015-11-16 DIAGNOSIS — O24419 Gestational diabetes mellitus in pregnancy, unspecified control: Secondary | ICD-10-CM | POA: Diagnosis not present

## 2015-11-16 DIAGNOSIS — Z36 Encounter for antenatal screening of mother: Secondary | ICD-10-CM | POA: Insufficient documentation

## 2015-11-16 DIAGNOSIS — Z3A25 25 weeks gestation of pregnancy: Secondary | ICD-10-CM | POA: Diagnosis not present

## 2015-11-27 ENCOUNTER — Encounter: Payer: Self-pay | Admitting: Obstetrics and Gynecology

## 2015-12-04 ENCOUNTER — Ambulatory Visit (INDEPENDENT_AMBULATORY_CARE_PROVIDER_SITE_OTHER): Payer: Self-pay | Admitting: Obstetrics & Gynecology

## 2015-12-04 VITALS — BP 120/66 | HR 75 | Temp 98.3°F | Wt 183.0 lb

## 2015-12-04 DIAGNOSIS — O99212 Obesity complicating pregnancy, second trimester: Secondary | ICD-10-CM

## 2015-12-04 DIAGNOSIS — Z23 Encounter for immunization: Secondary | ICD-10-CM

## 2015-12-04 DIAGNOSIS — Z6830 Body mass index (BMI) 30.0-30.9, adult: Secondary | ICD-10-CM

## 2015-12-04 DIAGNOSIS — O24419 Gestational diabetes mellitus in pregnancy, unspecified control: Secondary | ICD-10-CM

## 2015-12-04 DIAGNOSIS — E669 Obesity, unspecified: Secondary | ICD-10-CM

## 2015-12-04 DIAGNOSIS — O0992 Supervision of high risk pregnancy, unspecified, second trimester: Secondary | ICD-10-CM

## 2015-12-04 LAB — CBC
HEMATOCRIT: 34.8 % — AB (ref 36.0–46.0)
Hemoglobin: 11.9 g/dL — ABNORMAL LOW (ref 12.0–15.0)
MCH: 29.8 pg (ref 26.0–34.0)
MCHC: 34.2 g/dL (ref 30.0–36.0)
MCV: 87.2 fL (ref 78.0–100.0)
MPV: 10.1 fL (ref 8.6–12.4)
PLATELETS: 219 10*3/uL (ref 150–400)
RBC: 3.99 MIL/uL (ref 3.87–5.11)
RDW: 13.2 % (ref 11.5–15.5)
WBC: 8 10*3/uL (ref 4.0–10.5)

## 2015-12-04 MED ORDER — TETANUS-DIPHTH-ACELL PERTUSSIS 5-2.5-18.5 LF-MCG/0.5 IM SUSP
0.5000 mL | Freq: Once | INTRAMUSCULAR | Status: AC
  Administered 2015-12-04: 0.5 mL via INTRAMUSCULAR

## 2015-12-04 NOTE — Progress Notes (Signed)
Subjective:  Autumn Lawrence is a 20 y.o. G1P0000 at [redacted]w[redacted]d being seen today for ongoing prenatal care.  She is currently monitored for the following issues for this high-risk pregnancy and has Supervision of high-risk pregnancy; A1GDM; and Obesity (BMI 30-39.9) on her problem list.  Patient reports no complaints.  Contractions: Not present. Vag. Bleeding: None.  Movement: Present. Denies leaking of fluid.   The following portions of the patient's history were reviewed and updated as appropriate: allergies, current medications, past family history, past medical history, past social history, past surgical history and problem list. Problem list updated.  Objective:   Filed Vitals:   12/04/15 1205  BP: 120/66  Pulse: 75  Temp: 98.3 F (36.8 C)  Weight: 183 lb (83.008 kg)    Fetal Status: Fetal Heart Rate (bpm): 158 Fundal Height: 28 cm Movement: Present     General:  Alert, oriented and cooperative. Patient is in no acute distress.  Skin: Skin is warm and dry. No rash noted.   Cardiovascular: Normal heart rate noted  Respiratory: Normal respiratory effort, no problems with respiration noted  Abdomen: Soft, gravid, appropriate for gestational age. Pain/Pressure: Present     Pelvic: Vag. Bleeding: None Vag D/C Character: White   Cervical exam deferred        Extremities: Normal range of motion.  Edema: None  Mental Status: Normal mood and affect. Normal behavior. Normal judgment and thought content.   Urinalysis: pending  Assessment and Plan:  Pregnancy: G1P0000 at [redacted]w[redacted]d  1. Supervision of high-risk pregnancy, second trimester - CBC - RPR - HIV antibody (with reflex) - pick pediatrician - RN will put urine in computer.  Preterm labor symptoms and general obstetric precautions including but not limited to vaginal bleeding, contractions, leaking of fluid and fetal movement were reviewed in detail with the patient. Please refer to After Visit Summary for other counseling  recommendations.  Return in about 2 weeks (around 12/18/2015).   Guss Bunde, MD

## 2015-12-04 NOTE — Progress Notes (Signed)
Patient reports pain in RUQ during meals and at night when she is trying sleep  Discussed breastfeeding with patient

## 2015-12-05 LAB — POCT URINALYSIS DIP (DEVICE)
BILIRUBIN URINE: NEGATIVE
Glucose, UA: NEGATIVE mg/dL
HGB URINE DIPSTICK: NEGATIVE
Ketones, ur: NEGATIVE mg/dL
LEUKOCYTES UA: NEGATIVE
NITRITE: NEGATIVE
Protein, ur: NEGATIVE mg/dL
Specific Gravity, Urine: 1.025 (ref 1.005–1.030)
Urobilinogen, UA: 0.2 mg/dL (ref 0.0–1.0)
pH: 6 (ref 5.0–8.0)

## 2015-12-05 LAB — HIV ANTIBODY (ROUTINE TESTING W REFLEX): HIV: NONREACTIVE

## 2015-12-05 LAB — RPR

## 2015-12-14 ENCOUNTER — Encounter: Payer: Self-pay | Admitting: Advanced Practice Midwife

## 2016-01-18 ENCOUNTER — Ambulatory Visit (INDEPENDENT_AMBULATORY_CARE_PROVIDER_SITE_OTHER): Payer: Medicaid Other | Admitting: Family Medicine

## 2016-01-18 VITALS — BP 117/74 | HR 85 | Wt 185.7 lb

## 2016-01-18 DIAGNOSIS — O0992 Supervision of high risk pregnancy, unspecified, second trimester: Secondary | ICD-10-CM

## 2016-01-18 DIAGNOSIS — O99213 Obesity complicating pregnancy, third trimester: Secondary | ICD-10-CM | POA: Diagnosis not present

## 2016-01-18 DIAGNOSIS — E669 Obesity, unspecified: Secondary | ICD-10-CM | POA: Diagnosis not present

## 2016-01-18 DIAGNOSIS — O24419 Gestational diabetes mellitus in pregnancy, unspecified control: Secondary | ICD-10-CM | POA: Diagnosis present

## 2016-01-18 LAB — POCT URINALYSIS DIP (DEVICE)
Bilirubin Urine: NEGATIVE
GLUCOSE, UA: NEGATIVE mg/dL
Hgb urine dipstick: NEGATIVE
Ketones, ur: NEGATIVE mg/dL
Leukocytes, UA: NEGATIVE
NITRITE: NEGATIVE
PROTEIN: 30 mg/dL — AB
Specific Gravity, Urine: >=1.03 (ref 1.005–1.030)
UROBILINOGEN UA: 0.2 mg/dL (ref 0.0–1.0)
pH: 6 (ref 5.0–8.0)

## 2016-01-18 NOTE — Patient Instructions (Signed)

## 2016-01-18 NOTE — Progress Notes (Signed)
Subjective:  Autumn Lawrence is a 20 y.o. G1P0000 at [redacted]w[redacted]d being seen today for ongoing prenatal care.  She is currently monitored for the following issues for this high-risk pregnancy and has Supervision of high-risk pregnancy; A1GDM; and Obesity (BMI 30-39.9) on her problem list.  GDM diet controlled.  Did not bring book.  Reports all fasting CBGs < 90.  Occasional PP CBG high, but mostly < 120.    Patient reports no complaints.  Contractions: Not present. Vag. Bleeding: None.  Movement: Present. Denies leaking of fluid.   The following portions of the patient's history were reviewed and updated as appropriate: allergies, current medications, past family history, past medical history, past social history, past surgical history and problem list. Problem list updated.  Objective:   Filed Vitals:   01/18/16 1051  BP: 117/74  Pulse: 85  Weight: 185 lb 11.2 oz (84.233 kg)    Fetal Status: Fetal Heart Rate (bpm): 160   Movement: Present     General:  Alert, oriented and cooperative. Patient is in no acute distress.  Skin: Skin is warm and dry. No rash noted.   Cardiovascular: Normal heart rate noted  Respiratory: Normal respiratory effort, no problems with respiration noted  Abdomen: Soft, gravid, appropriate for gestational age. Pain/Pressure: Present     Pelvic: Vag. Bleeding: None Vag D/C Character: White   Cervical exam deferred        Extremities: Normal range of motion.  Edema: None  Mental Status: Normal mood and affect. Normal behavior. Normal judgment and thought content.   Urinalysis:      Assessment and Plan:  Pregnancy: G1P0000 at [redacted]w[redacted]d  1. Supervision of high-risk pregnancy, second trimester FHT normal, FH normal  2. Gestational diabetes mellitus in second trimester, unspecified diabetic control Patient to bring log next week.    3. Obesity (BMI 30-39.9)   Preterm labor symptoms and general obstetric precautions including but not limited to vaginal bleeding,  contractions, leaking of fluid and fetal movement were reviewed in detail with the patient. Please refer to After Visit Summary for other counseling recommendations.  No Follow-up on file.   Truett Mainland, DO

## 2016-01-25 ENCOUNTER — Ambulatory Visit (INDEPENDENT_AMBULATORY_CARE_PROVIDER_SITE_OTHER): Payer: Medicaid Other | Admitting: Certified Nurse Midwife

## 2016-01-25 VITALS — BP 132/70 | HR 78 | Wt 188.0 lb

## 2016-01-25 DIAGNOSIS — O0993 Supervision of high risk pregnancy, unspecified, third trimester: Secondary | ICD-10-CM | POA: Diagnosis present

## 2016-01-25 LAB — POCT URINALYSIS DIP (DEVICE)
Bilirubin Urine: NEGATIVE
Glucose, UA: NEGATIVE mg/dL
Hgb urine dipstick: NEGATIVE
Ketones, ur: NEGATIVE mg/dL
Nitrite: NEGATIVE
Protein, ur: NEGATIVE mg/dL
Specific Gravity, Urine: 1.01 (ref 1.005–1.030)
Urobilinogen, UA: 0.2 mg/dL (ref 0.0–1.0)
pH: 6 (ref 5.0–8.0)

## 2016-01-25 NOTE — Progress Notes (Signed)
BG log reviewed. Values wnl

## 2016-01-25 NOTE — Patient Instructions (Signed)
Group B streptococcus (GBS) is a type of bacteria often found in healthy women. GBS is not the same as the bacteria that causes strep throat. You may have GBS in your vagina, rectum, or bladder. GBS does not spread through sexual contact, but it can be passed to a baby during childbirth. This can be dangerous for your baby. It is not dangerous to you and usually does not cause any symptoms. Your health care provider may test you for GBS when your pregnancy is between 35 and 37 weeks. GBS is dangerous only during birth, so there is no need to test for it earlier. It is possible to have GBS during pregnancy and never pass it to your baby. If your test results are positive for GBS, your health care provider may recommend giving you antibiotic medicine during delivery to make sure your baby stays healthy. RISK FACTORS You are more likely to pass GBS to your baby if:   Your water breaks (ruptured membrane) or you go into labor before 37 weeks.  Your water breaks 18 hours before you deliver.  You passed GBS during a previous pregnancy.  You have a urinary tract infection caused by GBS any time during pregnancy.  You have a fever during labor. SYMPTOMS Most women who have GBS do not have any symptoms. If you have a urinary tract infection caused by GBS, you might have frequent or painful urination and fever. Babies who get GBS usually show symptoms within 7 days of birth. Symptoms may include:   Breathing problems.  Heart and blood pressure problems.  Digestive and kidney problems. DIAGNOSIS Routine screening for GBS is recommended for all pregnant women. A health care provider takes a sample of the fluid in your vagina and rectum with a swab. It is then sent to a lab to be checked for GBS. A sample of your urine may also be checked for the bacteria.  TREATMENT If you test positive for GBS, you may need treatment with an antibiotic medicine during labor. As soon as you go into labor, or as soon as  your membranes rupture, you will get the antibiotic medicine through an IV access. You will continue to get the medicine until after you give birth. You do not need antibiotic medicine if you are having a cesarean delivery.If your baby shows signs or symptoms of GBS after birth, your baby can also be treated with an antibiotic medicine. HOME CARE INSTRUCTIONS   Take all antibiotic medicine as prescribed by your health care provider. Only take medicine as directed.   Continue with prenatal visits and care.   Keep all follow-up appointments.  SEEK MEDICAL CARE IF:   You have pain when you urinate.   You have to urinate frequently.   You have a fever.  SEEK IMMEDIATE MEDICAL CARE IF:   Your membranes rupture.  You go into labor.   This information is not intended to replace advice given to you by your health care provider. Make sure you discuss any questions you have with your health care provider.   Document Released: 12/17/2007 Document Revised: 09/14/2013 Document Reviewed: 07/02/2013 Elsevier Interactive Patient Education 2016 [employer]  

## 2016-01-25 NOTE — Progress Notes (Signed)
Subjective:  Autumn Lawrence is a 20 y.o. G1P0000 at [redacted]w[redacted]d being seen today for ongoing prenatal care.  She is currently monitored for the following issues for this high-risk pregnancy and has Supervision of high-risk pregnancy; A1GDM; and Obesity (BMI 30-39.9) on her problem list.  Patient reports no complaints.  Contractions: Not present. Vag. Bleeding: None.  Movement: Present. Denies leaking of fluid.   The following portions of the patient's history were reviewed and updated as appropriate: allergies, current medications, past family history, past medical history, past social history, past surgical history and problem list. Problem list updated.  Objective:   Filed Vitals:   01/25/16 1349  BP: 132/70  Pulse: 78  Weight: 188 lb (85.276 kg)    Fetal Status: Fetal Heart Rate (bpm): 140 Fundal Height: 35 cm Movement: Present     General:  Alert, oriented and cooperative. Patient is in no acute distress.  Skin: Skin is warm and dry. No rash noted.   Cardiovascular: Normal heart rate noted  Respiratory: Normal respiratory effort, no problems with respiration noted  Abdomen: Soft, gravid, appropriate for gestational age. Pain/Pressure: Present     Pelvic: Vag. Bleeding: None     Cervical exam deferred        Extremities: Normal range of motion.  Edema: Trace  Mental Status: Normal mood and affect. Normal behavior. Normal judgment and thought content.   Urinalysis: Urine Protein: Negative Urine Glucose: Negative  Assessment and Plan:  Pregnancy: G1P0000 at [redacted]w[redacted]d  1. Supervision of high-risk pregnancy, third trimester   Preterm labor symptoms and general obstetric precautions including but not limited to vaginal bleeding, contractions, leaking of fluid and fetal movement were reviewed in detail with the patient. Please refer to After Visit Summary for other counseling recommendations.  Return in about 1 week (around 02/01/2016).   Larey Days, CNM

## 2016-01-25 NOTE — Progress Notes (Signed)
Breastfeeding discussed with patient

## 2016-02-01 ENCOUNTER — Encounter: Payer: Medicaid Other | Admitting: Advanced Practice Midwife

## 2016-02-12 ENCOUNTER — Ambulatory Visit (INDEPENDENT_AMBULATORY_CARE_PROVIDER_SITE_OTHER): Payer: Medicaid Other | Admitting: Obstetrics and Gynecology

## 2016-02-12 ENCOUNTER — Other Ambulatory Visit (HOSPITAL_COMMUNITY)
Admission: RE | Admit: 2016-02-12 | Discharge: 2016-02-12 | Disposition: A | Discharge status: Home / Self Care | Payer: Medicaid Other | Source: Ambulatory Visit | Attending: Obstetrics and Gynecology | Admitting: Obstetrics and Gynecology

## 2016-02-12 VITALS — BP 121/59 | HR 72 | Wt 188.5 lb

## 2016-02-12 DIAGNOSIS — Z113 Encounter for screening for infections with a predominantly sexual mode of transmission: Secondary | ICD-10-CM | POA: Insufficient documentation

## 2016-02-12 DIAGNOSIS — O24319 Unspecified pre-existing diabetes mellitus in pregnancy, unspecified trimester: Secondary | ICD-10-CM

## 2016-02-12 DIAGNOSIS — O24419 Gestational diabetes mellitus in pregnancy, unspecified control: Secondary | ICD-10-CM

## 2016-02-12 DIAGNOSIS — O0993 Supervision of high risk pregnancy, unspecified, third trimester: Secondary | ICD-10-CM

## 2016-02-12 LAB — POCT URINALYSIS DIP (DEVICE)
Bilirubin Urine: NEGATIVE
GLUCOSE, UA: NEGATIVE mg/dL
Hgb urine dipstick: NEGATIVE
KETONES UR: NEGATIVE mg/dL
LEUKOCYTES UA: NEGATIVE
Nitrite: NEGATIVE
PROTEIN: NEGATIVE mg/dL
SPECIFIC GRAVITY, URINE: 1.015 (ref 1.005–1.030)
UROBILINOGEN UA: 0.2 mg/dL (ref 0.0–1.0)
pH: 6 (ref 5.0–8.0)

## 2016-02-12 LAB — OB RESULTS CONSOLE GBS: STREP GROUP B AG: POSITIVE

## 2016-02-12 NOTE — Progress Notes (Signed)
Subjective:  Autumn Lawrence is a 20 y.o. G1P0000 at [redacted]w[redacted]d being seen today for ongoing prenatal care.  She is currently monitored for the following issues for this high-risk pregnancy and has Supervision of high-risk pregnancy; a1/bdm; and Obesity (BMI 30-39.9) on her problem list.  Patient reports no complaints.  Contractions: Not present.  .  Movement: Present. Denies leaking of fluid.   The following portions of the patient's history were reviewed and updated as appropriate: allergies, current medications, past family history, past medical history, past social history, past surgical history and problem list. Problem list updated.  Objective:   Filed Vitals:   02/12/16 1018  BP: 121/59  Pulse: 72  Weight: 188 lb 8 oz (85.503 kg)    Fetal Status: Fetal Heart Rate (bpm): 155   Movement: Present     General:  Alert, oriented and cooperative. Patient is in no acute distress.  Skin: Skin is warm and dry. No rash noted.   Cardiovascular: Normal heart rate noted  Respiratory: Normal respiratory effort, no problems with respiration noted  Abdomen: Soft, gravid, appropriate for gestational age. Pain/Pressure: Present     Pelvic:       Cervical exam deferred        Extremities: Normal range of motion.     Mental Status: Normal mood and affect. Normal behavior. Normal judgment and thought content.   Urinalysis:      Assessment and Plan:  Pregnancy: G1P0000 at [redacted]w[redacted]d  #A1/BDM - dx @ 14 weeks, will start NSTs this week - u/s for growth ordered - glucose wnl per log - gbs/g/c today. nkda - hrob f/u one week  There are no diagnoses linked to this encounter. term labor symptoms and general obstetric precautions including but not limited to vaginal bleeding, contractions, leaking of fluid and fetal movement were reviewed in detail with the patient. Please refer to After Visit Summary for other counseling recommendations.    Gwynne Edinger, MD

## 2016-02-12 NOTE — Progress Notes (Signed)
U/S 05/31 @ 1045am

## 2016-02-13 LAB — CULTURE, BETA STREP (GROUP B ONLY)

## 2016-02-13 LAB — GC/CHLAMYDIA PROBE AMP (~~LOC~~) NOT AT ARMC
Chlamydia: NEGATIVE
Neisseria Gonorrhea: NEGATIVE

## 2016-02-15 ENCOUNTER — Ambulatory Visit (INDEPENDENT_AMBULATORY_CARE_PROVIDER_SITE_OTHER): Payer: Medicaid Other | Admitting: *Deleted

## 2016-02-15 VITALS — BP 134/68 | HR 80

## 2016-02-15 DIAGNOSIS — E119 Type 2 diabetes mellitus without complications: Secondary | ICD-10-CM | POA: Diagnosis not present

## 2016-02-15 DIAGNOSIS — O24319 Unspecified pre-existing diabetes mellitus in pregnancy, unspecified trimester: Secondary | ICD-10-CM

## 2016-02-15 DIAGNOSIS — O24313 Unspecified pre-existing diabetes mellitus in pregnancy, third trimester: Secondary | ICD-10-CM | POA: Diagnosis present

## 2016-02-15 NOTE — Progress Notes (Signed)
NST 02/15/16 reactive

## 2016-02-21 ENCOUNTER — Ambulatory Visit (HOSPITAL_COMMUNITY)
Admission: RE | Admit: 2016-02-21 | Discharge: 2016-02-21 | Disposition: A | Discharge status: Home / Self Care | Payer: Medicaid Other | Source: Ambulatory Visit | Attending: Obstetrics and Gynecology | Admitting: Obstetrics and Gynecology

## 2016-02-21 ENCOUNTER — Ambulatory Visit (INDEPENDENT_AMBULATORY_CARE_PROVIDER_SITE_OTHER): Payer: Medicaid Other | Admitting: Family

## 2016-02-21 ENCOUNTER — Telehealth (HOSPITAL_COMMUNITY): Payer: Self-pay | Admitting: *Deleted

## 2016-02-21 ENCOUNTER — Encounter (HOSPITAL_COMMUNITY): Payer: Self-pay | Admitting: *Deleted

## 2016-02-21 ENCOUNTER — Encounter: Payer: Self-pay | Admitting: General Practice

## 2016-02-21 ENCOUNTER — Encounter (HOSPITAL_COMMUNITY): Payer: Self-pay

## 2016-02-21 VITALS — BP 126/67 | HR 81 | Wt 189.2 lb

## 2016-02-21 DIAGNOSIS — E119 Type 2 diabetes mellitus without complications: Secondary | ICD-10-CM

## 2016-02-21 DIAGNOSIS — O0993 Supervision of high risk pregnancy, unspecified, third trimester: Secondary | ICD-10-CM

## 2016-02-21 DIAGNOSIS — O24419 Gestational diabetes mellitus in pregnancy, unspecified control: Secondary | ICD-10-CM | POA: Diagnosis present

## 2016-02-21 DIAGNOSIS — O24319 Unspecified pre-existing diabetes mellitus in pregnancy, unspecified trimester: Secondary | ICD-10-CM

## 2016-02-21 DIAGNOSIS — O24313 Unspecified pre-existing diabetes mellitus in pregnancy, third trimester: Secondary | ICD-10-CM

## 2016-02-21 DIAGNOSIS — Z3A38 38 weeks gestation of pregnancy: Secondary | ICD-10-CM | POA: Insufficient documentation

## 2016-02-21 HISTORY — DX: Gestational diabetes mellitus in pregnancy, unspecified control: O24.419

## 2016-02-21 LAB — POCT URINALYSIS DIP (DEVICE)
BILIRUBIN URINE: NEGATIVE
GLUCOSE, UA: NEGATIVE mg/dL
Hgb urine dipstick: NEGATIVE
KETONES UR: NEGATIVE mg/dL
Leukocytes, UA: NEGATIVE
NITRITE: NEGATIVE
Protein, ur: NEGATIVE mg/dL
Specific Gravity, Urine: 1.025 (ref 1.005–1.030)
Urobilinogen, UA: 0.2 mg/dL (ref 0.0–1.0)
pH: 6 (ref 5.0–8.0)

## 2016-02-21 NOTE — Progress Notes (Signed)
Subjective:  Autumn Lawrence is a 20 y.o. G1P0000 at [redacted]w[redacted]d being seen today for ongoing prenatal care.  She is currently monitored for the following issues for this high-risk pregnancy and has Supervision of high-risk pregnancy; A1/BDM; and Obesity (BMI 30-39.9) on her problem list.  Patient reports no complaints.  Contractions: Not present. Vag. Bleeding: None.  Movement: Present. Denies leaking of fluid.   The following portions of the patient's history were reviewed and updated as appropriate: allergies, current medications, past family history, past medical history, past social history, past surgical history and problem list. Problem list updated.  Objective:   CBG's wnl  Filed Vitals:   02/21/16 0935  BP: 126/67  Pulse: 81  Weight: 189 lb 3.2 oz (85.821 kg)    Fetal Status: Fetal Heart Rate (bpm): NST-R Fundal Height: 37 cm Movement: Present     General:  Alert, oriented and cooperative. Patient is in no acute distress.  Skin: Skin is warm and dry. No rash noted.   Cardiovascular: Normal heart rate noted  Respiratory: Normal respiratory effort, no problems with respiration noted  Abdomen: Soft, gravid, appropriate for gestational age. Pain/Pressure: Present     Pelvic: Vag. Bleeding: None     Cervical exam deferred        Extremities: Normal range of motion.  Edema: Trace  Mental Status: Normal mood and affect. Normal behavior. Normal judgment and thought content.   Urinalysis: Urine Protein: Negative Urine Glucose: Negative  Assessment and Plan:  Pregnancy: G1P0000 at [redacted]w[redacted]d  1. Maternal pregestational diabetes classes B through R, antepartum (HCC) - Fetal nonstress test - NST reactive - Consulted with Dr. Roselie Awkward > IOL at 39 wks  2. Supervision of high-risk pregnancy, third trimester - BPP and Growth [place] today  Term labor symptoms and general obstetric precautions including but not limited to vaginal bleeding, contractions, leaking of fluid and fetal movement were reviewed  in detail with the patient. Please refer to After Visit Summary for other counseling recommendations.  Return in about 1 week (around 02/28/2016) for as scheduled.   Venia Carbon Michiel Cowboy, CNM

## 2016-02-21 NOTE — Progress Notes (Signed)
[place] for growth & BPP today

## 2016-02-21 NOTE — Telephone Encounter (Signed)
Preadmission screen  

## 2016-02-23 ENCOUNTER — Other Ambulatory Visit: Payer: Self-pay | Admitting: Advanced Practice Midwife

## 2016-02-24 ENCOUNTER — Inpatient Hospital Stay (HOSPITAL_COMMUNITY): Payer: Medicaid Other | Admitting: Anesthesiology

## 2016-02-24 ENCOUNTER — Encounter (HOSPITAL_COMMUNITY): Payer: Self-pay

## 2016-02-24 ENCOUNTER — Inpatient Hospital Stay (HOSPITAL_COMMUNITY)
Admission: RE | Admit: 2016-02-24 | Discharge: 2016-02-26 | DRG: 775 | Disposition: A | Discharge status: Home / Self Care | Payer: Medicaid Other | Source: Ambulatory Visit | Attending: Family Medicine | Admitting: Family Medicine

## 2016-02-24 VITALS — BP 120/83 | HR 67 | Temp 98.0°F | Resp 16 | Ht 61.5 in | Wt 189.0 lb

## 2016-02-24 DIAGNOSIS — O24319 Unspecified pre-existing diabetes mellitus in pregnancy, unspecified trimester: Secondary | ICD-10-CM

## 2016-02-24 DIAGNOSIS — Z833 Family history of diabetes mellitus: Secondary | ICD-10-CM | POA: Diagnosis not present

## 2016-02-24 DIAGNOSIS — O99824 Streptococcus B carrier state complicating childbirth: Secondary | ICD-10-CM | POA: Diagnosis present

## 2016-02-24 DIAGNOSIS — Z87891 Personal history of nicotine dependence: Secondary | ICD-10-CM

## 2016-02-24 DIAGNOSIS — Z8249 Family history of ischemic heart disease and other diseases of the circulatory system: Secondary | ICD-10-CM | POA: Diagnosis not present

## 2016-02-24 DIAGNOSIS — Z6830 Body mass index (BMI) 30.0-30.9, adult: Secondary | ICD-10-CM | POA: Diagnosis not present

## 2016-02-24 DIAGNOSIS — O24419 Gestational diabetes mellitus in pregnancy, unspecified control: Secondary | ICD-10-CM

## 2016-02-24 DIAGNOSIS — O99214 Obesity complicating childbirth: Secondary | ICD-10-CM | POA: Diagnosis present

## 2016-02-24 DIAGNOSIS — O24420 Gestational diabetes mellitus in childbirth, diet controlled: Principal | ICD-10-CM | POA: Diagnosis present

## 2016-02-24 DIAGNOSIS — Z3A39 39 weeks gestation of pregnancy: Secondary | ICD-10-CM

## 2016-02-24 DIAGNOSIS — E669 Obesity, unspecified: Secondary | ICD-10-CM | POA: Diagnosis present

## 2016-02-24 DIAGNOSIS — O0990 Supervision of high risk pregnancy, unspecified, unspecified trimester: Secondary | ICD-10-CM

## 2016-02-24 LAB — GLUCOSE, CAPILLARY
GLUCOSE-CAPILLARY: 79 mg/dL (ref 65–99)
GLUCOSE-CAPILLARY: 70 mg/dL (ref 65–99)
Glucose-Capillary: 81 mg/dL (ref 65–99)
Glucose-Capillary: 77 mg/dL (ref 65–99)

## 2016-02-24 LAB — CBC
HCT: 36.8 % (ref 36.0–46.0)
Hemoglobin: 12.7 g/dL (ref 12.0–15.0)
MCH: 29.1 pg (ref 26.0–34.0)
MCHC: 34.5 g/dL (ref 30.0–36.0)
MCV: 84.2 fL (ref 78.0–100.0)
PLATELETS: 219 10*3/uL (ref 150–400)
RBC: 4.37 MIL/uL (ref 3.87–5.11)
RDW: 13.9 % (ref 11.5–15.5)
WBC: 8.6 10*3/uL (ref 4.0–10.5)

## 2016-02-24 LAB — TYPE AND SCREEN
ABO/RH(D): O POS
Antibody Screen: NEGATIVE

## 2016-02-24 LAB — ABO/RH: ABO/RH(D): O POS

## 2016-02-24 LAB — RPR: RPR: NONREACTIVE

## 2016-02-24 MED ORDER — PENICILLIN G POTASSIUM 5000000 UNITS IJ SOLR
5.0000 10*6.[IU] | Freq: Once | INTRAVENOUS | Status: AC
  Administered 2016-02-24: 5 10*6.[IU] via INTRAVENOUS
  Filled 2016-02-24: qty 5

## 2016-02-24 MED ORDER — OXYCODONE-ACETAMINOPHEN 5-325 MG PO TABS: 2.0000 | ORAL_TABLET | ORAL | Status: DC | PRN

## 2016-02-24 MED ORDER — OXYCODONE-ACETAMINOPHEN 5-325 MG PO TABS: 1.0000 | ORAL_TABLET | ORAL | Status: DC | PRN

## 2016-02-24 MED ORDER — OXYTOCIN 40 UNITS IN LACTATED RINGERS INFUSION - SIMPLE MED
2.5000 [IU]/h | INTRAVENOUS | Status: DC
  Filled 2016-02-24: qty 1000

## 2016-02-24 MED ORDER — BENZOCAINE-MENTHOL 20-0.5 % EX AERO
1.0000 "application " | INHALATION_SPRAY | CUTANEOUS | Status: DC | PRN
  Filled 2016-02-24: qty 56

## 2016-02-24 MED ORDER — SENNOSIDES-DOCUSATE SODIUM 8.6-50 MG PO TABS
2.0000 | ORAL_TABLET | ORAL | Status: DC
  Administered 2016-02-25 (×2): 2 via ORAL
  Filled 2016-02-24 (×2): qty 2

## 2016-02-24 MED ORDER — ZOLPIDEM TARTRATE 5 MG PO TABS: 5.0000 mg | ORAL_TABLET | Freq: Every evening | ORAL | Status: DC | PRN

## 2016-02-24 MED ORDER — OXYTOCIN 40 UNITS IN LACTATED RINGERS INFUSION - SIMPLE MED
1.0000 m[IU]/min | INTRAVENOUS | Status: DC
  Administered 2016-02-24: 2 m[IU]/min via INTRAVENOUS

## 2016-02-24 MED ORDER — DIBUCAINE 1 % RE OINT
1.0000 "application " | TOPICAL_OINTMENT | RECTAL | Status: DC | PRN
  Filled 2016-02-24: qty 28.4

## 2016-02-24 MED ORDER — ACETAMINOPHEN 325 MG PO TABS: 650.0000 mg | ORAL_TABLET | ORAL | Status: DC | PRN

## 2016-02-24 MED ORDER — FENTANYL CITRATE (PF) 100 MCG/2ML IJ SOLN
100.0000 ug | INTRAMUSCULAR | Status: DC | PRN
  Administered 2016-02-24: 100 ug via INTRAVENOUS
  Filled 2016-02-24: qty 2

## 2016-02-24 MED ORDER — SIMETHICONE 80 MG PO CHEW: 80.0000 mg | CHEWABLE_TABLET | ORAL | Status: DC | PRN

## 2016-02-24 MED ORDER — TERBUTALINE SULFATE 1 MG/ML IJ SOLN: 0.2500 mg | Freq: Once | INTRAMUSCULAR | Status: DC | PRN

## 2016-02-24 MED ORDER — PENICILLIN G POTASSIUM 5000000 UNITS IJ SOLR
2.5000 10*6.[IU] | INTRAVENOUS | Status: DC
  Administered 2016-02-24 (×3): 2.5 10*6.[IU] via INTRAVENOUS
  Filled 2016-02-24 (×4): qty 2.5

## 2016-02-24 MED ORDER — EPHEDRINE 5 MG/ML INJ: 10.0000 mg | INTRAVENOUS | Status: DC | PRN

## 2016-02-24 MED ORDER — LIDOCAINE HCL (PF) 1 % IJ SOLN
INTRAMUSCULAR | Status: DC | PRN
  Administered 2016-02-24 (×2): 5 mL

## 2016-02-24 MED ORDER — FENTANYL 2.5 MCG/ML BUPIVACAINE 1/10 % EPIDURAL INFUSION (WH - ANES)
14.0000 mL/h | INTRAMUSCULAR | Status: DC | PRN
  Administered 2016-02-24: 14 mL/h via EPIDURAL
  Filled 2016-02-24: qty 125

## 2016-02-24 MED ORDER — TETANUS-DIPHTH-ACELL PERTUSSIS 5-2.5-18.5 LF-MCG/0.5 IM SUSP
0.5000 mL | Freq: Once | INTRAMUSCULAR | Status: DC
  Filled 2016-02-24: qty 0.5

## 2016-02-24 MED ORDER — ONDANSETRON HCL 4 MG/2ML IJ SOLN: 4.0000 mg | Freq: Four times a day (QID) | INTRAMUSCULAR | Status: DC | PRN

## 2016-02-24 MED ORDER — LACTATED RINGERS IV SOLN
500.0000 mL | Freq: Once | INTRAVENOUS | Status: DC
Start: 2016-02-24 — End: 2016-02-24

## 2016-02-24 MED ORDER — PHENYLEPHRINE 40 MCG/ML (10ML) SYRINGE FOR IV PUSH (FOR BLOOD PRESSURE SUPPORT)
80.0000 ug | PREFILLED_SYRINGE | INTRAVENOUS | Status: DC | PRN
  Filled 2016-02-24: qty 10

## 2016-02-24 MED ORDER — IBUPROFEN 600 MG PO TABS
600.0000 mg | ORAL_TABLET | Freq: Four times a day (QID) | ORAL | Status: DC
  Administered 2016-02-25 – 2016-02-26 (×6): 600 mg via ORAL
  Filled 2016-02-24 (×6): qty 1

## 2016-02-24 MED ORDER — PHENYLEPHRINE 40 MCG/ML (10ML) SYRINGE FOR IV PUSH (FOR BLOOD PRESSURE SUPPORT)
80.0000 ug | PREFILLED_SYRINGE | INTRAVENOUS | Status: DC | PRN
  Administered 2016-02-24: 80 ug via INTRAVENOUS

## 2016-02-24 MED ORDER — PRENATAL MULTIVITAMIN CH
1.0000 | ORAL_TABLET | Freq: Every day | ORAL | Status: DC
  Administered 2016-02-25: 1 via ORAL
  Filled 2016-02-24: qty 1

## 2016-02-24 MED ORDER — DIPHENHYDRAMINE HCL 50 MG/ML IJ SOLN: 12.5000 mg | INTRAMUSCULAR | Status: DC | PRN

## 2016-02-24 MED ORDER — MISOPROSTOL 25 MCG QUARTER TABLET
25.0000 ug | ORAL_TABLET | ORAL | Status: DC
  Filled 2016-02-24: qty 0.25

## 2016-02-24 MED ORDER — ONDANSETRON HCL 4 MG/2ML IJ SOLN: 4.0000 mg | INTRAMUSCULAR | Status: DC | PRN

## 2016-02-24 MED ORDER — DIPHENHYDRAMINE HCL 25 MG PO CAPS: 25.0000 mg | ORAL_CAPSULE | Freq: Four times a day (QID) | ORAL | Status: DC | PRN

## 2016-02-24 MED ORDER — OXYTOCIN BOLUS FROM INFUSION
500.0000 mL | INTRAVENOUS | Status: DC
  Administered 2016-02-24: 500 mL via INTRAVENOUS

## 2016-02-24 MED ORDER — ONDANSETRON HCL 4 MG PO TABS: 4.0000 mg | ORAL_TABLET | ORAL | Status: DC | PRN

## 2016-02-24 MED ORDER — SOD CITRATE-CITRIC ACID 500-334 MG/5ML PO SOLN: 30.0000 mL | ORAL | Status: DC | PRN

## 2016-02-24 MED ORDER — WITCH HAZEL-GLYCERIN EX PADS: 1.0000 "application " | MEDICATED_PAD | CUTANEOUS | Status: DC | PRN

## 2016-02-24 MED ORDER — LACTATED RINGERS IV SOLN
INTRAVENOUS | Status: DC
  Administered 2016-02-24 (×2): via INTRAVENOUS

## 2016-02-24 MED ORDER — COCONUT OIL OIL
1.0000 "application " | TOPICAL_OIL | Status: DC | PRN
  Administered 2016-02-26: 1 via TOPICAL
  Filled 2016-02-24 (×2): qty 120

## 2016-02-24 MED ORDER — LACTATED RINGERS IV SOLN: 500.0000 mL | INTRAVENOUS | Status: DC | PRN

## 2016-02-24 MED ORDER — LIDOCAINE HCL (PF) 1 % IJ SOLN: 30.0000 mL | INTRAMUSCULAR | Status: DC | PRN

## 2016-02-24 NOTE — Anesthesia Procedure Notes (Signed)
Epidural Patient location during procedure: OB  Staffing Anesthesiologist: Chavela Justiniano Performed by: anesthesiologist   Preanesthetic Checklist Completed: patient identified, site marked, surgical consent, pre-op evaluation, timeout performed, IV checked, risks and benefits discussed and monitors and equipment checked  Epidural Patient position: sitting Prep: DuraPrep Patient monitoring: heart rate, continuous pulse ox and blood pressure Approach: right paramedian Location: L3-L4 Injection technique: LOR saline  Needle:  Needle type: Tuohy  Needle gauge: 17 G Needle length: 9 cm and 9 Needle insertion depth: 6 cm Catheter type: closed end flexible Catheter size: 20 Guage Catheter at skin depth: 11 cm Test dose: negative  Assessment Events: blood not aspirated, injection not painful, no injection resistance, negative IV test and no paresthesia  Additional Notes Patient identified. Risks/Benefits/Options discussed with patient including but not limited to bleeding, infection, nerve damage, paralysis, failed block, incomplete pain control, headache, blood pressure changes, nausea, vomiting, reactions to medication both or allergic, itching and postpartum back pain. Confirmed with bedside nurse the patient's most recent platelet count. Confirmed with patient that they are not currently taking any anticoagulation, have any bleeding history or any family history of bleeding disorders. Patient expressed understanding and wished to proceed. All questions were answered. Sterile technique was used throughout the entire procedure. Please see nursing notes for vital signs. Test dose was given through epidural needle and negative prior to continuing to dose epidural or start infusion. Warning signs of high block given to the patient including shortness of breath, tingling/numbness in hands, complete motor block, or any concerning symptoms with instructions to call for help. Patient was given  instructions on fall risk and not to get out of bed. All questions and concerns addressed with instructions to call with any issues.   

## 2016-02-24 NOTE — Anesthesia Preprocedure Evaluation (Signed)
Anesthesia Evaluation  Patient identified by MRN, date of birth, ID band Patient awake    Reviewed: Allergy & Precautions, H&P , NPO status , Patient's Chart, lab work & pertinent test results  History of Anesthesia Complications Negative for: history of anesthetic complications  Airway Mallampati: II  TM Distance: >3 FB Neck ROM: full    Dental no notable dental hx. (+) Teeth Intact   Pulmonary neg pulmonary ROS, former smoker,    Pulmonary exam normal breath sounds clear to auscultation       Cardiovascular negative cardio ROS Normal cardiovascular exam Rhythm:regular Rate:Normal     Neuro/Psych negative neurological ROS  negative psych ROS   GI/Hepatic negative GI ROS, Neg liver ROS,   Endo/Other  negative endocrine ROSdiabetes  Renal/GU negative Renal ROS  negative genitourinary   Musculoskeletal   Abdominal   Peds  Hematology negative hematology ROS (+)   Anesthesia Other Findings   Reproductive/Obstetrics (+) Pregnancy                             Anesthesia Physical Anesthesia Plan  ASA: II  Anesthesia Plan: Epidural   Post-op Pain Management:    Induction:   Airway Management Planned:   Additional Equipment:   Intra-op Plan:   Post-operative Plan:   Informed Consent: I have reviewed the patients History and Physical, chart, labs and discussed the procedure including the risks, benefits and alternatives for the proposed anesthesia with the patient or authorized representative who has indicated his/her understanding and acceptance.     Plan Discussed with:   Anesthesia Plan Comments:         Anesthesia Quick Evaluation

## 2016-02-24 NOTE — Anesthesia Pain Management Evaluation Note (Signed)
  CRNA Pain Management Visit Note  Patient: Autumn Lawrence, 20 y.o., female  "Hello I am a member of the anesthesia team at [facility]. We have an anesthesia team available at all times to provide care throughout the hospital, including epidural management and anesthesia for C-section. I don't know your plan for the delivery whether it a natural birth, water birth, IV sedation, nitrous supplementation, doula or epidural, but we want to meet your pain goals."   1.Was your pain managed to your expectations on prior hospitalizations? Yes    2.What is your expectation for pain management during this hospitalization?     Epidural  3.How can we help you reach that goal? Epidural when appropriate  Record the patient's initial score and the patient's pain goal.   Pain: 0  Pain Goal: 4  The [facility] wants you to be able to say your pain was always managed very well.  [facility] 02/24/2016

## 2016-02-24 NOTE — H&P (Signed)
OBSTETRIC ADMISSION HISTORY AND PHYSICAL  Autumn Lawrence is a 20 y.o. female G1P0000 with IUP at [redacted]w[redacted]d by LMP presenting for IOL for A2/BGDM. She reports +FMs, No LOF, no VB, no blurry vision, headaches or peripheral edema, and RUQ pain.  She plans on formula  feeding. She request oral pills  for birth control.  Dating: By LMP --->  Estimated Date of Delivery: 02/29/16  Clinic  [facility] Prenatal Labs  Dating  LMP  Blood type: O/Positive/-- (12/12 0000)   Genetic Screen  Quad: negative Antibody:Negative (12/12 0000)  Anatomic [place] Normal girl at 25 weeks Rubella: Immune (12/12 0000)  GTT Dx with GDM at health dept RPR: Nonreactive (12/12 0000)   Flu vaccine  declined for now (counseled) HBsAg: Negative (12/12 0000)   TDaP vaccine   12/04/15                                            Rhogam: HIV: Non-reactive (12/12 0000)   Baby Food  bottle (breastfeeding counseled)                                         GBS:  positive  Contraception  POP > OCP Pap: negative  Circumcision girl  UDS: + marijuana  Pediatrician ?? Undecided; has list   Support Person Verdon Cummins     Prenatal History/Complications:  Past Medical History: Past Medical History  Diagnosis Date  . Medical history non-contributory   . Gestational diabetes     diet controlled    Past Surgical History: Past Surgical History  Procedure Laterality Date  . Diagnostic laparoscopy    . Ovary removed Left   . Laparoscopy N/A 06/27/2014    Procedure: Operative Laparoscopy with Right Ovarian Cystectomy;  Surgeon: Sanjuana Kava, MD;  Location: [facility];  Service: Gynecology;  Laterality: N/A;    Obstetrical History: OB History    Gravida Para Term Preterm AB TAB SAB Ectopic Multiple Living   1 0 0 0 0 0 0 0 0 0       Social History: Social History   Social History  . Marital Status: Single    Spouse Name: N/A  . Number of Children: N/A  . Years of Education: N/A   Social History Main Topics  . Smoking status: Former Research scientist (life sciences)   . Smokeless tobacco: Never Used  . Alcohol Use: No  . Drug Use: No  . Sexual Activity: Yes    Birth Control/ Protection: None   Other Topics Concern  . None   Social History Narrative    Family History: Family History  Problem Relation Age of Onset  . Diabetes Maternal Grandmother   . Cancer Maternal Grandmother   . Hypertension Maternal Grandmother   . Diabetes Maternal Grandfather     Allergies: No Known Allergies  Prescriptions prior to admission  Medication Sig Dispense Refill Last Dose  . aspirin EC 81 MG tablet Take 1 tablet (81 mg total) by mouth daily. Take after 12 weeks for prevention of preeclampsia later in pregnancy (Patient not taking: Reported on 02/24/2016) 300 tablet 2 Taking     Review of Systems   All systems reviewed and negative except as stated in HPI  Blood pressure 143/84, pulse 95, temperature 98.3 F (36.8 C), temperature source  Oral, resp. rate 20, height 5' 1.5" (1.562 m), weight 189 lb (85.73 kg), last menstrual period 05/25/2015. General appearance: alert, cooperative and appears stated age Lungs: clear to auscultation bilaterally Heart: regular rate and rhythm Abdomen: soft, non-tender; bowel sounds normal Pelvic: adequate Extremities: Homans sign is negative, no sign of DVT DTR's wnl Presentation: cephalic Fetal monitoringBaseline: 145 bpm, Variability: Good {> 6 bpm), Accelerations: Reactive and Decelerations: Absent Uterine activityFrequency: Every 5-10 minutes Dilation: 3 Effacement (%): 70, 80 Station: -1 Exam by:: Lina Sar RN   Prenatal labs: ABO, Rh: O/Positive/-- (12/12 0000) Antibody: Negative (12/12 0000) Rubella: Immune (12/12 0000) RPR: NON REAC (03/13 1247)  HBsAg: Negative (12/12 0000)  HIV: NONREACTIVE (03/13 1247)  GBS: Positive (05/22 0000)  1 hr Glucola 190@14wks  Genetic screening  Quad neg Anatomy US wnl  Prenatal Transfer Tool  Maternal Diabetes: Yes:  Diabetes Type:  Diet controlled- diagnosed  early at 14 wks Genetic Screening: Normal Maternal Ultrasounds/Referrals: Normal Fetal Ultrasounds or other Referrals:  None Maternal Substance Abuse:  No Significant Maternal Medications:  None Significant Maternal Lab Results: Lab values include: Group B Strep positive  Results for orders placed or performed during the hospital encounter of 02/24/16 (from the past 24 hour(s))  Glucose, capillary   Collection Time: 02/24/16  7:52 AM  Result Value Ref Range   Glucose-Capillary 79 65 - 99 mg/dL  CBC   Collection Time: 02/24/16  8:10 AM  Result Value Ref Range   WBC 8.6 4.0 - 10.5 K/uL   RBC 4.37 3.87 - 5.11 MIL/uL   Hemoglobin 12.7 12.0 - 15.0 g/dL   HCT 36.8 36.0 - 46.0 %   MCV 84.2 78.0 - 100.0 fL   MCH 29.1 26.0 - 34.0 pg   MCHC 34.5 30.0 - 36.0 g/dL   RDW 13.9 11.5 - 15.5 %   Platelets 219 150 - 400 K/uL    Patient Active Problem List   Diagnosis Date Noted  . GDM, class A2 02/24/2016  . Obesity (BMI 30-39.9) 10/16/2015  . Supervision of high-risk pregnancy 10/02/2015  . A1/BDM 10/02/2015    Assessment/Plan:  Autumn Lawrence is a 20 y.o. G1P0000 at [redacted]w[redacted]d here for IOL for A2/B GDM, diet controlled.   #Labor: favorable bishop > 8. Will start pitocin  #Pain: Prn meds  #FWB: Cat i #ID:  GBS pos, start PCN #MOF: bottle #MOC: pills #Circ:  Na, female.   #GDM: CBG q 4 hours  [name], MD  02/24/2016, 9:39 AM

## 2016-02-25 NOTE — Lactation Note (Signed)
This note was copied from a baby's chart. Lactation Consultation Note: Follow up visit with mom. She states baby latched on with RN assist and nursed for 8-10 min. LS by RN 5. Encouraged to call for assist at next feeding  Patient Name: Autumn Lawrence Date: 02/25/2016     Maternal Data    Feeding Feeding Type: Breast Milk Length of feed: 7 min  LATCH Score/Interventions       Lactation Tools Discussed/Used     Consult Status      Truddie Crumble 02/25/2016, 1:30 PM

## 2016-02-25 NOTE — Progress Notes (Signed)
Post Partum Day 1 Subjective:  Autumn Lawrence is a 20 y.o. G1P1001 [redacted]w[redacted]d s/p NSVD.  No acute events overnight.  Pt denies problems with ambulating, voiding or po intake.  She denies nausea or vomiting.  Pain is well controlled.  She has had flatus.  Lochia Small.  Plan for birth control is oral contraceptives (estrogen/progesterone).  Method of Feeding: breast  Objective: Blood pressure 125/71, pulse 82, temperature 98.4 F (36.9 C), temperature source Oral, resp. rate 18, height 5' 1.5" (1.562 m), weight 189 lb (85.73 kg), last menstrual period 05/25/2015, SpO2 100 %, unknown if currently breastfeeding.  Physical Exam:  General: alert, cooperative and no distress Lochia:normal flow Chest: normal WOB Heart: Regular rate Abdomen: soft, mild TTP (appropriate) Uterine Fundus: firm DVT Evaluation: No evidence of DVT seen on physical exam. Extremities: trace edema   Recent Labs  02/24/16 0810  HGB 12.7  HCT 36.8    Assessment/Plan:  ASSESSMENT: Autumn Lawrence is a 20 y.o. G1P1001 [redacted]w[redacted]d s/p NSVD  Plan for discharge tomorrow, Breastfeeding and Lactation consult Continue routine PP care Breastfeeding support PRN  LOS: 1 day   [name] 02/25/2016, 9:39 AM

## 2016-02-25 NOTE — Progress Notes (Signed)
Patient's fasting cbg=115

## 2016-02-25 NOTE — Clinical Social Work Maternal (Signed)
CLINICAL SOCIAL WORK MATERNAL/CHILD NOTE  Patient Details  Name: Autumn Lawrence MRN: [id number] Date of Birth: 02/24/2018  Date:  02/25/2016  Clinical Social Worker Initiating Note:  Maxie Better, MSW, LCSW Date/ Time Initiated:  02/25/16/1028     Child's Name:  Autumn Lawrence (baby Autumn)   Legal Guardian:  Mother   Need for Interpreter:  None   Date of Referral:  02/25/16     Reason for Referral:   (history of substance [facility]))   Referral Source:  Physician   Address:  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    Education:  Attending college (patient is full time student at [employer] and plans to resume classes in fall)   Financial Resources:  Self-Pay    Other Resources:  Physicist, medical    Cultural/Religious Considerations Which May Impact Care:  none identified by patient or family  Strengths:  Ability to meet basic needs , Compliance with medical plan , Home prepared for child , Pediatrician chosen , Understanding of illness   Risk Factors/Current Problems:  Other (Comment) (possible hx of THC use. Patient denies use during pregnancy or hx of use.)   Cognitive State:  Able to Concentrate , Alert , Linear Thinking , Insightful , Goal Oriented    Mood/Affect:  Bright , Happy , Relaxed    CSW Assessment: Patient is 20 year old female living in [place], [place] ([place]) with her boyfriend. She recently gave birth to a healthy baby Autumn Social research officer, government) and plans to return home with significant other and newborn at discharge. Patient reports that her home is a safe place. She plans to stay at home  with the baby for 6 weeks and will return to work p/t at [employer] after this. 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 Patient has arranged for child care when she returns to work/school in 6 weeks. Social work consult made due to hx of THC use noted in patient chart; however, patient denies substance use prior to or during pregnancy. Rapid drug screen is negative for all substances and umbilical cord screen are pending currenty-CSW to assess. Patient denies hx of mental illness and reports no issues with depression/anxiety/mood instability during pregnancy or after giving birth to her daughter. Patient was pleasant and bright during assessment and chose to have her partner and her partner's mother remain in the room during assessment. CSW provided patient with "Perinatal Mood & Anxiety Disorders" handout and "Feelings After Birth" Pamphlet and encouraged patient to attend this support group if interested. CSW checking on lab results intermittently throughout the day. Patient and patient's family verbalized understanding of information provided/resources and voiced no concerns or barriers to discharge.   CSW Plan/Description:  Information/Referral to [employer] , [employer], [place], LCSW 02/25/2016, 10:41 AM

## 2016-02-25 NOTE — Lactation Note (Signed)
This note was copied from a baby's chart. Lactation Consultation Note; Baby now 59 hours old. Mom reports she was not really latching so she has given a bottle of formula twice because she wanted to make sure she was getting something to eat. Mom states she still wants to try breast feeding. Reviewed normal newborn behavior the first 24 hours and encouraged to offer the breast first at every feeding. BF brochure given with resources for support after DC. Encouraged to page for assist when baby showing feeding cues. No questions at present.   Patient Name: Autumn Lawrence [id number] Date: 02/25/2016 Reason for consult: Initial assessment   Maternal Data Formula Feeding for Exclusion: No Does the patient have breastfeeding experience prior to this delivery?: No  Feeding Feeding Type: Formula Nipple Type: Slow - flow  LATCH Score/Interventions                      Lactation Tools Discussed/Used     Consult Status Consult Status: Follow-up Date: 02/25/16 Follow-up type: In-patient    Truddie Crumble 02/25/2016, 9:14 AM

## 2016-02-25 NOTE — Lactation Note (Signed)
This note was copied from a baby's chart. Lactation Consultation Note; Mom called for assist with latch. Nipples flat. Baby latched to right breast in football hold. and nursed for 15 min. Mom reports tugging, no pain. Used manual pump to erect nipple and baby latched to left breast and nursed for 10 min. Reviewed use and cleaning of manual pump. Family pleased she had done so well. Encouraged not to use bottles or pacifiers until baby learns breast feeding. Discussed cluster feeding and encouraged to take a nap this afternoon. No further questions at present.   Patient Name: Autumn Lawrence [id number] Date: 02/25/2016 Reason for consult: Follow-up assessment   Maternal Data    Feeding Feeding Type: Breast Fed Length of feed: 25 min  LATCH Score/Interventions Latch: Grasps breast easily, tongue down, lips flanged, rhythmical sucking. Intervention(s): Skin to skin Intervention(s): Adjust position;Assist with latch;Breast compression  Audible Swallowing: A few with stimulation Intervention(s): Skin to skin  Type of Nipple: Flat Intervention(s): Hand pump  Comfort (Breast/Nipple): Soft / non-tender     Hold (Positioning): Assistance needed to correctly position infant at breast and maintain latch. Intervention(s): Breastfeeding basics reviewed  LATCH Score: 7  Lactation Tools Discussed/Used Tools: Pump Breast pump type: Manual   Consult Status Consult Status: Follow-up Date: 02/26/16 Follow-up type: In-patient    Truddie Crumble 02/25/2016, 2:48 PM

## 2016-02-25 NOTE — Anesthesia Postprocedure Evaluation (Signed)
Anesthesia Post Note  Patient: [facility]  Procedure(s) Performed: * No procedures listed *  Patient location during evaluation: Mother Baby Anesthesia Type: Epidural Level of consciousness: awake and alert Pain management: pain level controlled Vital Signs Assessment: post-procedure vital signs reviewed and stable Respiratory status: spontaneous breathing, nonlabored ventilation and respiratory function stable Cardiovascular status: stable Postop Assessment: no headache, no backache and epidural receding Anesthetic complications: no     Last Vitals:  Filed Vitals:   02/25/16 0115 02/25/16 0515  BP: 128/67 125/71  Pulse: 89 82  Temp: 37.1 C 36.9 C  Resp: 18 18    Last Pain:  Filed Vitals:   02/25/16 0527  PainSc: 0-No pain   Pain Goal:                 Riki Sheer

## 2016-02-26 ENCOUNTER — Other Ambulatory Visit: Payer: Medicaid Other

## 2016-02-26 LAB — GLUCOSE, CAPILLARY: Glucose-Capillary: 115 mg/dL — ABNORMAL HIGH (ref 65–99)

## 2016-02-26 MED ORDER — IBUPROFEN 600 MG PO TABS: 600.0000 mg | ORAL_TABLET | Freq: Four times a day (QID) | ORAL | Status: AC

## 2016-02-26 NOTE — Discharge Summary (Signed)
OB Discharge Summary  Patient Name: Autumn Lawrence DOB: [birth year] MRN: [id number]  Date of admission: 02/24/2016 Delivering MD: Wendee Beavers T   Date of discharge: 02/26/2016  Admitting diagnosis: 39wks induction  Intrauterine pregnancy: [redacted]w[redacted]d     Secondary diagnosis:Principal Problem:   A1/BDM Active Problems:   Supervision of high-risk pregnancy   Obesity (BMI 30-39.9)   GDM, class A2   NSVD (normal spontaneous vaginal delivery)  Additional problems:none     Discharge diagnosis: Term Pregnancy Delivered                                                                     Post partum procedures:none  Augmentation: Pitocin  Complications: None  Hospital course:  Onset of Labor With Vaginal Delivery     20 y.o. yo G1P1001 at [redacted]w[redacted]d was admitted in Active Labor on 02/24/2016. Patient had an uncomplicated labor course as follows:  Membrane Rupture Time/Date: 4:45 PM ,02/24/2016   Intrapartum Procedures: Episiotomy: None [1]                                         Lacerations:  None [1]  Patient had a delivery of a Viable infant. 02/24/2016  Information for the patient's newborn:  Chaille, Michaels [id number]  Delivery Method: Vaginal, Spontaneous Delivery (Filed from Delivery Summary)    Pateint had an uncomplicated postpartum course.  She is ambulating, tolerating a regular diet, passing flatus, and urinating well. Patient is discharged home in stable condition on 02/26/2016.    Physical exam  Filed Vitals:   02/25/16 0115 02/25/16 0515 02/25/16 1836 02/26/16 0540  BP: 128/67 125/71 112/74 120/83  Pulse: 89 82 77 67  Temp: 98.8 F (37.1 C) 98.4 F (36.9 C) 98.2 F (36.8 C) 98 F (36.7 C)  TempSrc: Oral Oral Oral Oral  Resp: 18 18 18 16   Height:      Weight:      SpO2: 100% 100% 100%    General: alert, cooperative and no distress Lochia: appropriate Uterine Fundus: firm Incision: N/A DVT Evaluation: No evidence of DVT seen on physical exam. Negative Homan's  sign. No cords or calf tenderness. Labs: Lab Results  Component Value Date   WBC 8.6 02/24/2016   HGB 12.7 02/24/2016   HCT 36.8 02/24/2016   MCV 84.2 02/24/2016   PLT 219 02/24/2016   CMP Latest Ref Rng 10/04/2015  Glucose 65 - 99 mg/dL -  BUN 7 - 25 mg/dL -  Creatinine 0.50 - 1.10 mg/dL 0.49(L)  Sodium 135 - 146 mmol/L -  Potassium 3.5 - 5.3 mmol/L -  Chloride 98 - 110 mmol/L -  CO2 20 - 31 mmol/L -  Calcium 8.6 - 10.2 mg/dL -  Total Protein 6.1 - 8.1 g/dL -  Total Bilirubin 0.2 - 1.2 mg/dL -  Alkaline Phos 33 - 115 U/L -  AST 10 - 30 U/L -  ALT 6 - 29 U/L -    Discharge instruction: per After Visit Summary and "Baby and Me Booklet".  After Visit Meds:    Medication List    ASK your doctor about these medications  aspirin EC 81 MG tablet  Take 1 tablet (81 mg total) by mouth daily. Take after 12 weeks for prevention of preeclampsia later in pregnancy        Diet: routine diet  Activity: Advance as tolerated. Pelvic rest for 6 weeks.   Outpatient follow up:6 weeks Follow up Appt:No future appointments. Follow up visit: No Follow-up on file.  Postpartum contraception: Combination OCPs  Newborn Data: Live born female  Birth Weight: 6 lb 9.1 oz (2980 g) APGAR: 9, 9  Baby Feeding: Bottle and Breast Disposition:home with mother   02/26/2016 Koren Shiver, CNM

## 2016-02-26 NOTE — Lactation Note (Signed)
This note was copied from a baby's chart. Lactation Consultation Note: Mother has been breast and bottle feeding. She states that her (R) nipple is sore and that she has a crack. Observed and saw a tiny scab on the (R) nipple. Mother taught hand expression again and observed large drops of colostrum. Mother very excited to see so much volume.advised mother to hand express and staff nurse to get coconut oil to apply on sore nipples. Mother was advised to pump using hand pump for 15 mins on each breast when she gives formula. Discussed supply and demand. Informed mother of infants need to cluster feed. Advised to cue base feed infant at least 8-12 times in 24 hours. Mother to continue to do frequent skin to skin. Mother was informed of BFSG times and  Number given for [facility] hotline. Mother is active with [facility]. Reviewed treatment plan to prevent severe engorgement.   Patient Name: Autumn Lawrence [id number] Date: 02/26/2016 Reason for consult: Follow-up assessment   Maternal Data    Feeding Feeding Type: Bottle Fed - Formula Length of feed: 15 min  LATCH Score/Interventions                      Lactation Tools Discussed/Used     Consult Status Consult Status: Complete    Darla Lesches 02/26/2016, 9:53 AM

## 2016-04-18 ENCOUNTER — Other Ambulatory Visit (HOSPITAL_COMMUNITY)
Admission: RE | Admit: 2016-04-18 | Discharge: 2016-04-18 | Disposition: A | Discharge status: Home / Self Care | Payer: Medicaid Other | Source: Ambulatory Visit | Attending: Family | Admitting: Family

## 2016-04-18 ENCOUNTER — Ambulatory Visit (INDEPENDENT_AMBULATORY_CARE_PROVIDER_SITE_OTHER): Payer: Medicaid Other | Admitting: Family

## 2016-04-18 VITALS — BP 113/72 | HR 68 | Temp 98.7°F | Wt 181.4 lb

## 2016-04-18 DIAGNOSIS — Z1151 Encounter for screening for human papillomavirus (HPV): Secondary | ICD-10-CM | POA: Insufficient documentation

## 2016-04-18 DIAGNOSIS — Z8632 Personal history of gestational diabetes: Secondary | ICD-10-CM | POA: Diagnosis not present

## 2016-04-18 DIAGNOSIS — Z01419 Encounter for gynecological examination (general) (routine) without abnormal findings: Secondary | ICD-10-CM | POA: Diagnosis present

## 2016-04-18 DIAGNOSIS — Z30011 Encounter for initial prescription of contraceptive pills: Secondary | ICD-10-CM | POA: Diagnosis not present

## 2016-04-18 DIAGNOSIS — Z124 Encounter for screening for malignant neoplasm of cervix: Secondary | ICD-10-CM

## 2016-04-18 MED ORDER — NORGESTREL-ETHINYL ESTRADIOL 0.3-30 MG-MCG PO TABS: 1.0000 | ORAL_TABLET | Freq: Every day | ORAL | 11 refills | Status: AC

## 2016-04-18 NOTE — Progress Notes (Signed)
Subjective:     Jaydaliz Bente is a 20 y.o. female who presents for a postpartum visit. She is 6 weeks postpartum following a vaginal delivery 02/24/2016. I have fully reviewed the prenatal and intrapartum course. The delivery was at 80 gestational weeks. Outcome:  Anesthesia: Epidual. Postpartum course has been uncomplicated. Baby's course has been uncomplicated. Baby is feeding by bottle. Bleeding not a this.  Bled for approximately 3 weeks.   Bowel function is normal. Bladder function is normal . Patient is sexually active. Contraception method is condoms.  . Postpartum depression screening: negative   The following portions of the patient's history were reviewed and updated as appropriate: allergies, current medications, past family history, past medical history, past social history, past surgical history and problem list.  Review of Systems Pertinent items are noted in HPI.   Objective:    BP 113/72   Pulse 68   Temp 98.7 F (37.1 C)   Wt 181 lb 6.4 oz (82.3 kg)   BMI 33.72 kg/m         General:  alert, cooperative and appears stated age   Breasts:  inspection negative, no nipple discharge or bleeding, no masses or nodularity palpable  Lungs: clear to auscultation bilaterally  Heart:  regular rate and rhythm, S1, S2 normal, no murmur, click, rub or gallop  Abdomen: soft, non-tender; bowel sounds normal; no masses,  no organomegaly   Vulva:  normal  Vagina: normal vagina, no discharge, exudate, lesion, or erythema; healed well  Cervix:  no cervical motion tenderness  Corpus: normal size, contour, position, consistency, mobility, non-tender  Adnexa:  normal adnexa  Rectal Exam: Not performed.   Assessment:     Normal postpartum exam. Pap smear done at today's visit.   Plan:    1. Contraception: OCP (estrogen/progesterone) 2. Pap smear to lab 3. Follow up as needed.    Venia Carbon Michiel Cowboy, CNM

## 2016-04-18 NOTE — Patient Instructions (Signed)
  Place postpartum visit patient instructions here.  

## 2016-04-19 LAB — CYTOLOGY - PAP

## 2016-11-29 IMAGING — US US MFM OB FOLLOW-UP
1 series · 14 of 28 positions shown · non-contrast
Comparison: none

[Series 1: us mfm ob follow-up · 38 acquisitions, 14 frames shown]
[im 2/38]
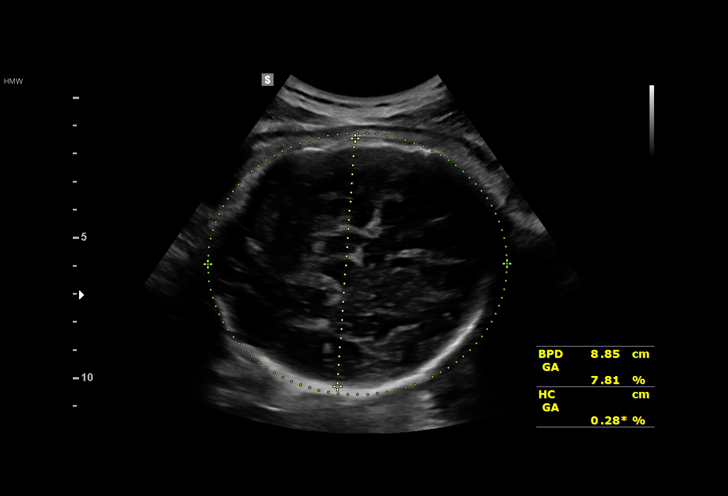
[im 5/38]
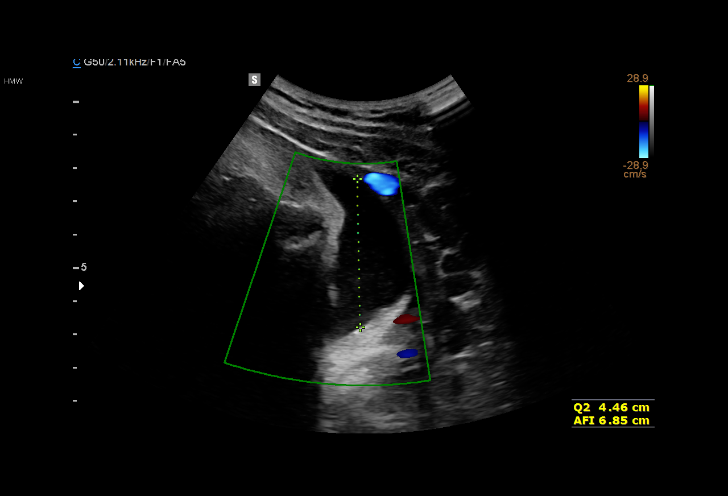
[im 7/38]
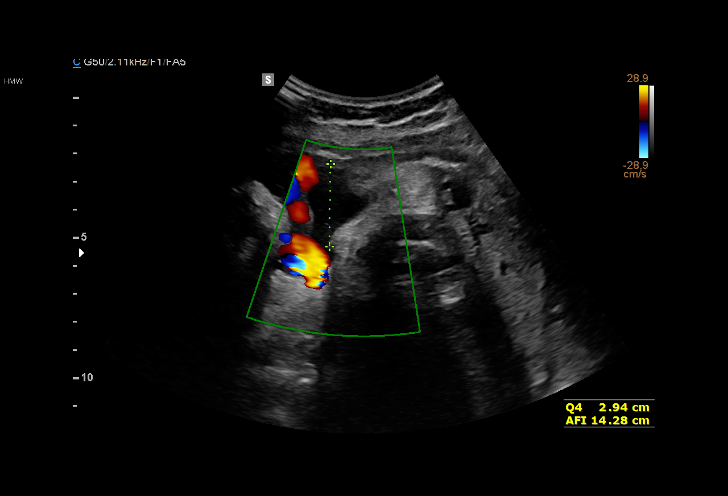
[im 10/38]
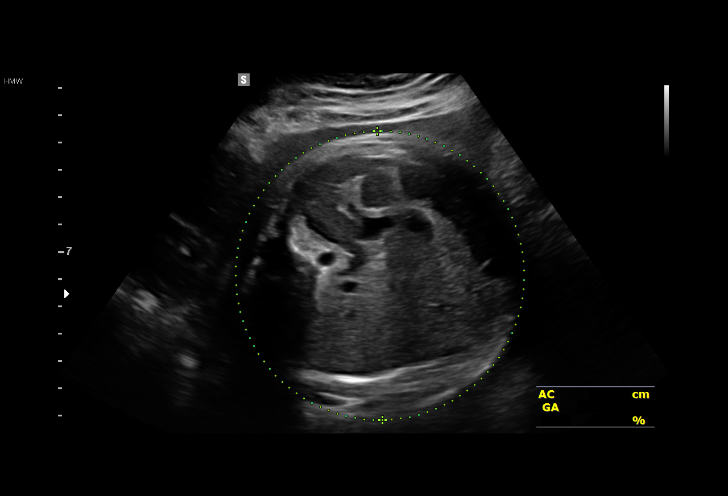
[im 13/38]
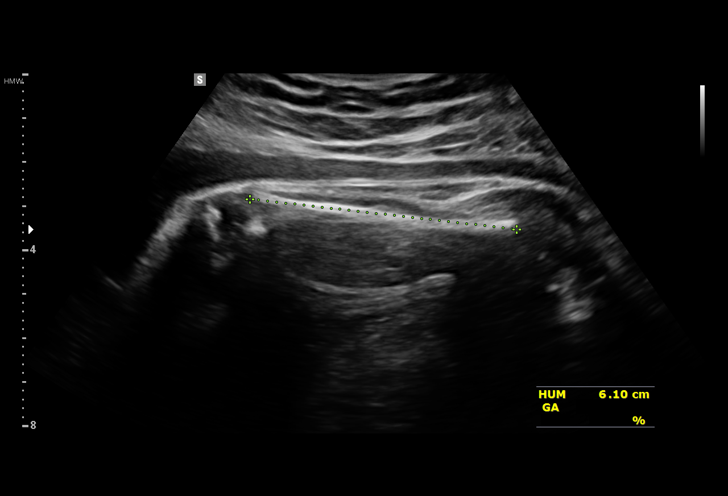
[im 16/38]
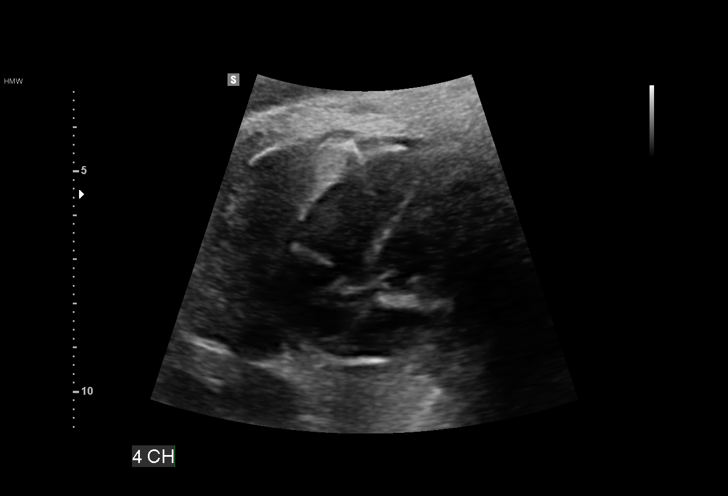
[im 18/38]
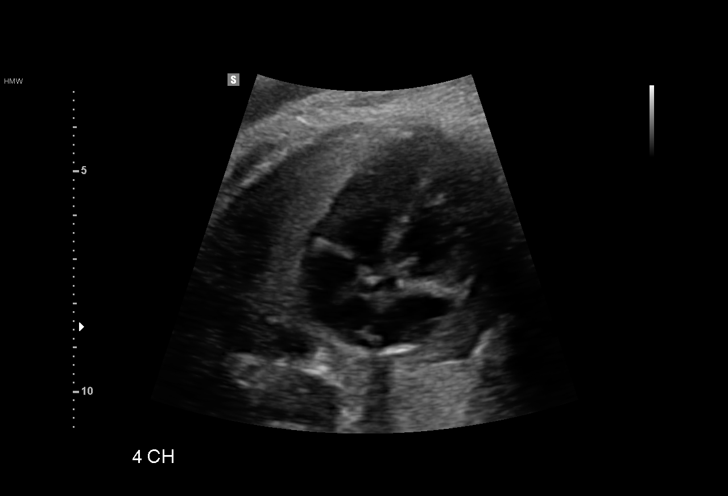
[im 21/38]
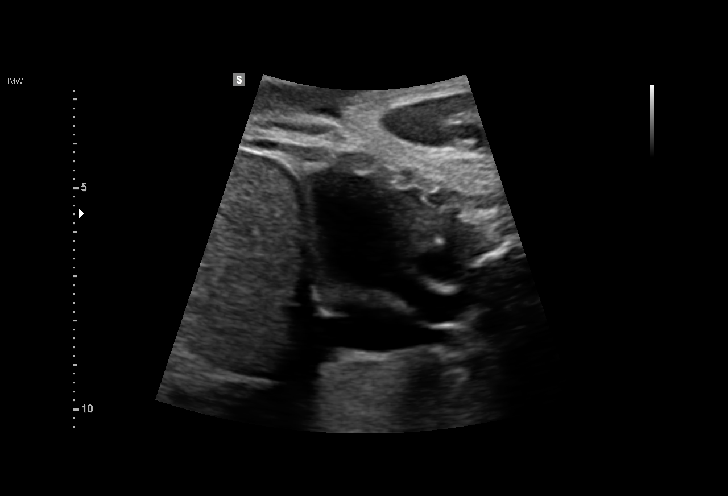
[im 24/38]
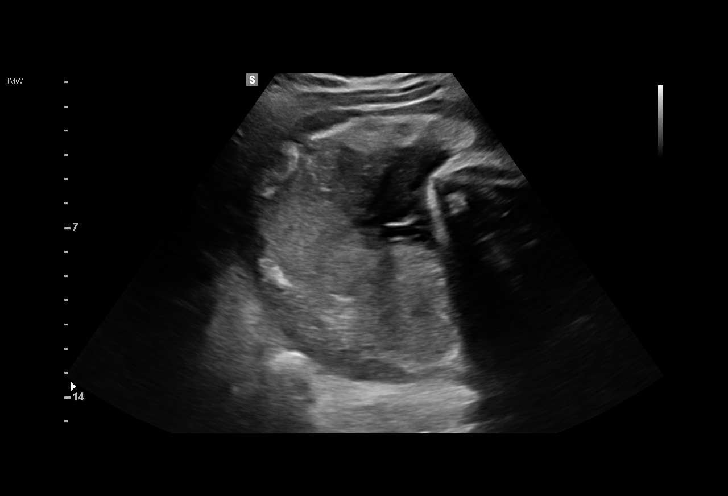
[im 27/38]
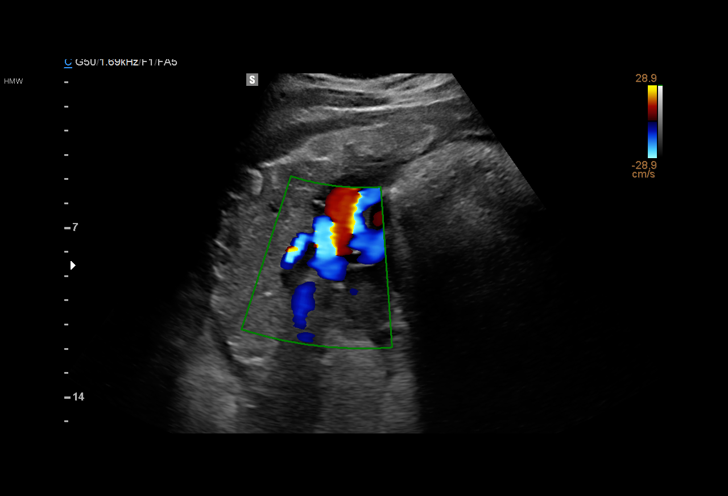
[im 29/38]
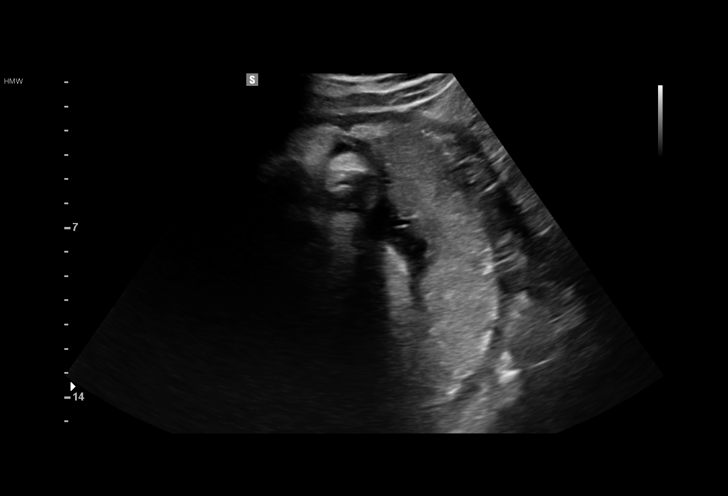
[im 32/38]
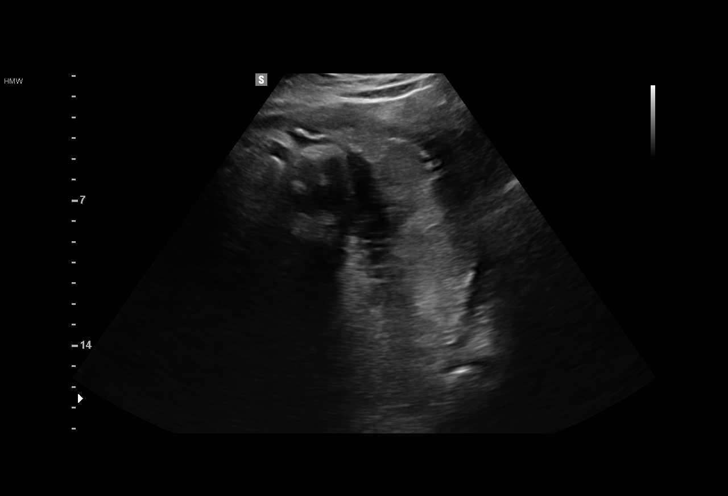
[im 35/38]
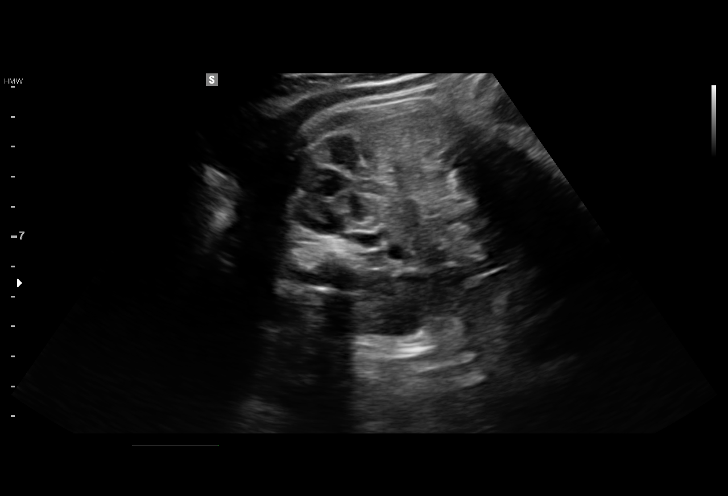
[im 38/38]
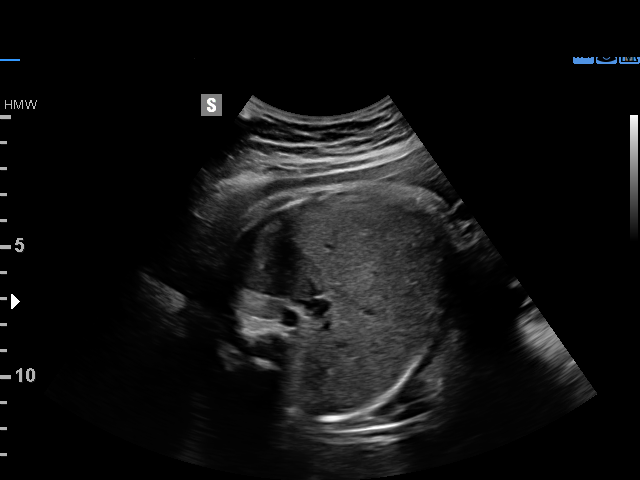

[14 of 28 positions shown; findings below may reference images not displayed]

Health
[facility]

1  SUKWOO GLENN                [id number]      [id number]     [id number]
Indications

38 weeks gestation of pregnancy
Gestational diabetes in pregnancy,
unspecified control
OB History

Blood Type:            Height:  5'1"   Weight (lb):  184       BMI:
Gravidity:    1
Fetal Evaluation

Num Of Fetuses:     1
Fetal Heart         153
Rate(bpm):
Cardiac Activity:   Observed
Presentation:       Cephalic
Placenta:           Posterior, above cervical os
P. Cord Insertion:  Visualized, central

Amniotic Fluid
AFI FV:      Subjectively within normal limits

AFI Sum(cm)     %Tile       Largest Pocket(cm)
14.28           57

RUQ(cm)       RLQ(cm)       LUQ(cm)        LLQ(cm)
2.39
Biometry

BPD:      89.3  mm     G. Age:  36w 1d         13  %    CI:         78.59  %    70 - 86
FL/HC:       22.3  %    20.6 -
HC:      318.6  mm     G. Age:  35w 6d        < 3  %    HC/AC:       0.96       0.87 -
AC:      331.7  mm     G. Age:  37w 0d         23  %    FL/BPD:      79.5  %    71 - 87
FL:         71  mm     G. Age:  36w 3d          7  %    FL/AC:       21.4  %    20 - 24
HUM:      62.1  mm     G. Age:  36w 0d         21  %

Est. FW:    8555   gm    6 lb 10 oz     35  %
Gestational Age

LMP:           38w 6d        Date:  05/25/15                 EDD:    02/29/16
U/S Today:     36w 3d                                        EDD:    03/17/16
Best:          38w 6d     Det. By:  LMP  (05/25/15)          EDD:    02/29/16
Anatomy

Cranium:               Appears normal         Aortic Arch:            Previously seen
Cavum:                 Previously seen        Ductal Arch:            Previously seen
Ventricles:            Appears normal         Diaphragm:              Previously seen
Choroid Plexus:        Previously seen        Stomach:                Appears normal, left
sided
Cerebellum:            Previously seen        Abdomen:                Previously seen
Posterior Fossa:       Previously seen        Abdominal Wall:         Previously seen
Nuchal Fold:           Previously seen        Cord Vessels:           Previously seen
Face:                  Orbits and profile     Kidneys:                Appear normal
previously seen
Lips:                  Previously seen        Bladder:                Appears normal
Thoracic:              Appears normal         Spine:                  Previously seen
Heart:                 Appears normal         Upper Extremities:      Previously seen
(4CH, axis, and
situs)
RVOT:                  Previously seen        Lower Extremities:      Previously seen
LVOT:                  Appears normal

Other:  Nasal bone visualized. Gender written in envelope.
Cervix Uterus Adnexa

Cervix
Not visualized (advanced GA >40wks)

Uterus
No abnormality visualized.

Left Ovary
Not visualized.

Right Ovary
Not visualized.

Cul De Sac:   No free fluid seen.

Adnexa:       No abnormality visualized.
Impression

SIUP at 38+6 weeks
Cephalic presentation
Normal interval anatomy; anatomic survey complete
Normal amniotic fluid volume
Appropriate interval growth with EFW at the 35th %tile
Recommendations

Follow-up as clinically indicated
